# Patient Record
Sex: Male | Born: 1975 | Race: Black or African American | Hispanic: No | Marital: Single | Smoking: Never smoker
Health system: Southern US, Community
[De-identification: ages and names within clinical notes are randomized; demographics above are authoritative.]

## PROBLEM LIST (undated history)

## (undated) DIAGNOSIS — I480 Paroxysmal atrial fibrillation: Secondary | ICD-10-CM

## (undated) DIAGNOSIS — I499 Cardiac arrhythmia, unspecified: Secondary | ICD-10-CM

## (undated) DIAGNOSIS — I10 Essential (primary) hypertension: Secondary | ICD-10-CM

## (undated) DIAGNOSIS — M1990 Unspecified osteoarthritis, unspecified site: Secondary | ICD-10-CM

## (undated) DIAGNOSIS — F840 Autistic disorder: Secondary | ICD-10-CM

## (undated) DIAGNOSIS — E669 Obesity, unspecified: Secondary | ICD-10-CM

## (undated) DIAGNOSIS — E785 Hyperlipidemia, unspecified: Secondary | ICD-10-CM

## (undated) DIAGNOSIS — R569 Unspecified convulsions: Secondary | ICD-10-CM

## (undated) HISTORY — DX: Obesity, unspecified: E66.9

## (undated) HISTORY — DX: Unspecified osteoarthritis, unspecified site: M19.90

## (undated) HISTORY — DX: Cardiac arrhythmia, unspecified: I49.9

## (undated) HISTORY — PX: FOOT SURGERY: SHX648

## (undated) HISTORY — DX: Essential (primary) hypertension: I10

## (undated) HISTORY — DX: Paroxysmal atrial fibrillation: I48.0

## (undated) HISTORY — DX: Hyperlipidemia, unspecified: E78.5

## (undated) HISTORY — PX: TONSILLECTOMY AND ADENOIDECTOMY: SUR1326

---

## 2016-02-10 DIAGNOSIS — F5101 Primary insomnia: Secondary | ICD-10-CM | POA: Insufficient documentation

## 2016-02-10 DIAGNOSIS — G894 Chronic pain syndrome: Secondary | ICD-10-CM

## 2016-02-10 HISTORY — DX: Primary insomnia: F51.01

## 2016-02-10 HISTORY — DX: Chronic pain syndrome: G89.4

## 2016-03-27 ENCOUNTER — Inpatient Hospital Stay (HOSPITAL_COMMUNITY)
Admission: AD | Admit: 2016-03-27 | Discharge: 2016-04-01 | DRG: 101 | Disposition: A | Discharge status: Home / Self Care | Payer: Medicare Other | Source: Other Acute Inpatient Hospital | Attending: Internal Medicine | Admitting: Internal Medicine

## 2016-03-27 ENCOUNTER — Encounter (HOSPITAL_COMMUNITY): Payer: Self-pay | Admitting: Internal Medicine

## 2016-03-27 DIAGNOSIS — S01512A Laceration without foreign body of oral cavity, initial encounter: Secondary | ICD-10-CM | POA: Diagnosis present

## 2016-03-27 DIAGNOSIS — M6282 Rhabdomyolysis: Secondary | ICD-10-CM | POA: Diagnosis present

## 2016-03-27 DIAGNOSIS — I480 Paroxysmal atrial fibrillation: Secondary | ICD-10-CM | POA: Diagnosis present

## 2016-03-27 DIAGNOSIS — Z885 Allergy status to narcotic agent status: Secondary | ICD-10-CM

## 2016-03-27 DIAGNOSIS — R569 Unspecified convulsions: Secondary | ICD-10-CM

## 2016-03-27 DIAGNOSIS — E785 Hyperlipidemia, unspecified: Secondary | ICD-10-CM | POA: Diagnosis present

## 2016-03-27 DIAGNOSIS — I493 Ventricular premature depolarization: Secondary | ICD-10-CM | POA: Diagnosis present

## 2016-03-27 DIAGNOSIS — Z8249 Family history of ischemic heart disease and other diseases of the circulatory system: Secondary | ICD-10-CM

## 2016-03-27 DIAGNOSIS — F840 Autistic disorder: Secondary | ICD-10-CM | POA: Diagnosis present

## 2016-03-27 DIAGNOSIS — E870 Hyperosmolality and hypernatremia: Secondary | ICD-10-CM | POA: Diagnosis present

## 2016-03-27 DIAGNOSIS — I10 Essential (primary) hypertension: Secondary | ICD-10-CM | POA: Diagnosis present

## 2016-03-27 DIAGNOSIS — G4700 Insomnia, unspecified: Secondary | ICD-10-CM | POA: Diagnosis present

## 2016-03-27 DIAGNOSIS — Z880 Allergy status to penicillin: Secondary | ICD-10-CM

## 2016-03-27 DIAGNOSIS — R509 Fever, unspecified: Secondary | ICD-10-CM

## 2016-03-27 DIAGNOSIS — G40409 Other generalized epilepsy and epileptic syndromes, not intractable, without status epilepticus: Principal | ICD-10-CM | POA: Diagnosis present

## 2016-03-27 DIAGNOSIS — N179 Acute kidney failure, unspecified: Secondary | ICD-10-CM | POA: Diagnosis present

## 2016-03-27 HISTORY — DX: Unspecified convulsions: R56.9

## 2016-03-27 HISTORY — DX: Essential (primary) hypertension: I10

## 2016-03-27 HISTORY — DX: Autistic disorder: F84.0

## 2016-03-27 HISTORY — DX: Paroxysmal atrial fibrillation: I48.0

## 2016-03-27 LAB — CBC
HEMATOCRIT: 40.9 % (ref 39.0–52.0)
Hemoglobin: 13.3 g/dL (ref 13.0–17.0)
MCH: 23.3 pg — ABNORMAL LOW (ref 26.0–34.0)
MCHC: 32.5 g/dL (ref 30.0–36.0)
MCV: 71.8 fL — AB (ref 78.0–100.0)
Platelets: 198 10*3/uL (ref 150–400)
RBC: 5.7 MIL/uL (ref 4.22–5.81)
RDW: 17.7 % — ABNORMAL HIGH (ref 11.5–15.5)
WBC: 13.1 10*3/uL — AB (ref 4.0–10.5)

## 2016-03-27 LAB — MRSA PCR SCREENING: MRSA BY PCR: NEGATIVE

## 2016-03-27 LAB — CREATININE, SERUM: Creatinine, Ser: 1.36 mg/dL — ABNORMAL HIGH (ref 0.61–1.24)

## 2016-03-27 LAB — CK: CK TOTAL: 848 U/L — AB (ref 49–397)

## 2016-03-27 MED ORDER — DOCUSATE SODIUM 100 MG PO CAPS
100.0000 mg | ORAL_CAPSULE | Freq: Two times a day (BID) | ORAL | Status: DC
  Administered 2016-03-28: 100 mg via ORAL
  Filled 2016-03-27 (×4): qty 1

## 2016-03-27 MED ORDER — FLECAINIDE ACETATE 50 MG PO TABS
50.0000 mg | ORAL_TABLET | Freq: Two times a day (BID) | ORAL | Status: DC
Start: 2016-03-27 — End: 2016-04-01
  Administered 2016-03-28 – 2016-04-01 (×9): 50 mg via ORAL
  Filled 2016-03-27 (×10): qty 1

## 2016-03-27 MED ORDER — SODIUM CHLORIDE 0.45 % IV SOLN
INTRAVENOUS | Status: DC
  Administered 2016-03-27: 21:00:00 via INTRAVENOUS

## 2016-03-27 MED ORDER — SODIUM CHLORIDE 0.9 % IV SOLN
500.0000 mg | Freq: Two times a day (BID) | INTRAVENOUS | Status: DC
  Administered 2016-03-27 – 2016-03-29 (×5): 500 mg via INTRAVENOUS
  Filled 2016-03-27 (×9): qty 5

## 2016-03-27 MED ORDER — HYDRALAZINE HCL 20 MG/ML IJ SOLN: 10.0000 mg | INTRAMUSCULAR | Status: DC | PRN

## 2016-03-27 MED ORDER — ASPIRIN 325 MG PO TABS
325.0000 mg | ORAL_TABLET | Freq: Every day | ORAL | Status: DC
  Administered 2016-03-28: 325 mg via ORAL
  Filled 2016-03-27: qty 1

## 2016-03-27 MED ORDER — SODIUM CHLORIDE 0.9% FLUSH
3.0000 mL | Freq: Two times a day (BID) | INTRAVENOUS | Status: DC
  Administered 2016-03-27 – 2016-04-01 (×5): 3 mL via INTRAVENOUS

## 2016-03-27 MED ORDER — METOPROLOL TARTRATE 25 MG PO TABS
25.0000 mg | ORAL_TABLET | Freq: Two times a day (BID) | ORAL | Status: DC
  Administered 2016-03-28 – 2016-03-29 (×2): 25 mg via ORAL
  Filled 2016-03-27 (×3): qty 1

## 2016-03-27 MED ORDER — TRAZODONE HCL 100 MG PO TABS: 100.0000 mg | ORAL_TABLET | Freq: Every evening | ORAL | Status: DC | PRN

## 2016-03-27 MED ORDER — ACETAMINOPHEN 325 MG PO TABS
650.0000 mg | ORAL_TABLET | Freq: Four times a day (QID) | ORAL | Status: DC | PRN
  Administered 2016-03-29 (×2): 650 mg via ORAL
  Filled 2016-03-27 (×2): qty 2

## 2016-03-27 MED ORDER — ACETAMINOPHEN 650 MG RE SUPP
650.0000 mg | Freq: Four times a day (QID) | RECTAL | Status: DC | PRN
Start: 2016-03-27 — End: 2016-04-01

## 2016-03-27 MED ORDER — MONTELUKAST SODIUM 10 MG PO TABS
10.0000 mg | ORAL_TABLET | Freq: Every day | ORAL | Status: DC
  Administered 2016-03-28 – 2016-03-31 (×4): 10 mg via ORAL
  Filled 2016-03-27 (×5): qty 1

## 2016-03-27 MED ORDER — PRAVASTATIN SODIUM 20 MG PO TABS: 20.0000 mg | ORAL_TABLET | Freq: Every day | ORAL | Status: DC

## 2016-03-27 MED ORDER — ENOXAPARIN SODIUM 40 MG/0.4ML ~~LOC~~ SOLN
40.0000 mg | SUBCUTANEOUS | Status: DC
  Administered 2016-03-27 – 2016-03-31 (×5): 40 mg via SUBCUTANEOUS
  Filled 2016-03-27 (×5): qty 0.4

## 2016-03-27 MED ORDER — AMLODIPINE BESYLATE 5 MG PO TABS
5.0000 mg | ORAL_TABLET | Freq: Every day | ORAL | Status: DC
  Administered 2016-03-28 – 2016-04-01 (×5): 5 mg via ORAL
  Filled 2016-03-27 (×5): qty 1

## 2016-03-27 MED ORDER — LORAZEPAM 2 MG/ML IJ SOLN
4.0000 mg | INTRAMUSCULAR | Status: DC
  Administered 2016-03-27 – 2016-03-28 (×3): 4 mg via INTRAVENOUS
  Filled 2016-03-27 (×3): qty 2

## 2016-03-27 NOTE — H&P (Addendum)
[facility] History and Physical  Adrian Carter FXJ:[id number] DOB: 1976/08/05 DOA: 03/27/2016  Referring physician:  PCP: No primary care provider on file.   Chief Complaint: I had a seizure at home today  HPI: Adrian Carter is a 40 y.o. male with past medical history significant for seizure disorder, hypertension, hyperlipidemia, autism initially presented to [facility] because patient reportedly had a seizure at home. EMS called and was post-ictal on their arrival with blood in oral cavity. Transported to [place] where patient had 2 more episodes of witnessed grand mal seizures in 2 minutes. He was given 4 mg of Ativan, 1000 units of fosphenytoin. Patient unfortunately cannot recall the events that transpired, he denied any headache, does report some pain in his mouth after he bit his tongue. Patient is unsure if he had any urinary or stool incontinence. He denied any recent fever, chills or sweating. Has not had any recent head trauma. Patient said that he's been compliant with his antiepileptics. Patient unfortunately cannot recall the name of the medication. Also follows with a neurologist but he cannot recall the name. Reviewed on medication list looks like he takes lorazepam 1 mg by mouth twice a day.  After patient was given the Ativan and fosphenytoin at the neighboring facility arrangements were made to transfer him over to [facility] for further neurologic evaluation. Patient had a CBC that showed a normal white blood cell count of 9, hemoglobin was 14.9 hematocrit was 49 and his platelet count was 243,000. Metabolic panel shows a serum sodium of 152, potassium of 3.9, chloride 109, bicarbonate of 9, BUN of 10 creatinine of 1.2 and glucose of 148. AST was 30, ALT was 28. His urine toxicology was negative. So showed a specific gravity 1.015, 2+ protein, few bacteria, negative for nitrites or leukocyte esterase. He underwent a CT of his head which showed sinus inflammation but no  acute intercranial abnormality. Currently patient thinks that he is at [facility]. Oriented only to self. Again report some mild pain in his tongue and also reports some leg weakness and tenderness in his quadriceps.   Review of Systems:  Constitutional: No weight loss, night sweats, Fevers, chills, fatigue.  HEENT: His post witnessed seizure in the ER, denied headache, Difficulty swallowing despite his painful swollen tongue,Tooth/dental problems,Sore throat, Cardio-vascular:No chest pain, Orthopnea, PND, swelling in lower extremities, anasarca, dizziness, palpitations  GI: No heartburn, indigestion, abdominal pain, nausea, vomiting, diarrhea, change in bowel habits, loss of appetite  Resp: No shortness of breath with exertion or at rest. No excess mucus, mild cough productive of some yellowish sputum, No coughing up of blood.No change in color of mucus.No wheezing.No chest wall deformity  Skin: no rash or lesions.  GU: no dysuria, change in color of urine, no urgency or frequency. No flank pain.  Musculoskeletal:  Mild tenderness in his quadriceps. No decreased range of motion. No back pain.  Psych: No change in mood or affect. No depression or anxiety. No memory loss.  Neuro: No change in sensation, unilateral strength, or cognitive abilities. Seizure as above in history of present illness  All other systems were reviewed and are negative.  No past medical history on file. No past surgical history on file. Social History:  has no tobacco, alcohol, and drug history on file.  Allergies not on file  No family history on file.   Prior to Admission medications   Not on File   Physical Exam: Filed Vitals:   03/27/16 1838  BP: 144/97  Pulse: 77  Temp: 99.1 F (37.3 C)  TempSrc: Oral  Resp: 27  Height: 6\' 1"  (1.854 m)  Weight: 93.7 kg (206 lb 9.1 oz)  SpO2: 100%    Wt Readings from Last 3 Encounters:  03/27/16 93.7 kg (206 lb 9.1 oz)    General:  Appears calm and  comfortable Eyes:  PERRL, EOMI, normal lids, iris ENT:  grossly normal hearing, lips & tongue Neck:  no LAD, masses or thyromegaly Cardiovascular:  RRR, no m/r/g. No LE edema.  Respiratory:  CTA bilaterally, no w/r/r. Normal respiratory effort. Abdomen:  soft, ntnd Skin:  no rash or induration seen on limited exam Musculoskeletal:  grossly normal tone BUE/BLE Psychiatric:  grossly normal mood and affect, speech fluent and appropriate Neurologic:  CN 2-12 grossly intact, moves all extremities in coordinated fashion.          Labs on Admission:  Basic Metabolic Panel: No results for input(s): NA, K, CL, CO2, GLUCOSE, BUN, CREATININE, CALCIUM, MG, PHOS in the last 168 hours. Liver Function Tests: No results for input(s): AST, ALT, ALKPHOS, BILITOT, PROT, ALBUMIN in the last 168 hours. No results for input(s): LIPASE, AMYLASE in the last 168 hours. No results for input(s): AMMONIA in the last 168 hours. CBC: No results for input(s): WBC, NEUTROABS, HGB, HCT, MCV, PLT in the last 168 hours. Cardiac Enzymes: No results for input(s): CKTOTAL, CKMB, CKMBINDEX, TROPONINI in the last 168 hours.  BNP (last 3 results) No results for input(s): BNP in the last 8760 hours.  ProBNP (last 3 results) No results for input(s): PROBNP in the last 8760 hours.   Creatinine clearance cannot be calculated (No order found.)  CBG: No results for input(s): GLUCAP in the last 168 hours.  Radiological Exams on Admission: No results found.  EKG: Independently reviewed.   Assessment/Plan Active Problems:   Seizure (HCC)   1. Grand mal seizure. Witnessed at the neighboring facility, he was given 4 mg of Ativan as well as 1000 units of fosphenytoin. Dr. 03/29/16 of general neurology was consult to see and evaluate the patient. Patient reports that he been compliant with his antiepileptic medication. Looks like he normally only takes Ativan 1 mg PO BID. Lives with his mother who normally administers all  of his medicine. Will provide the patient with 4 mg of Ativan when necessary recurrent seizure, if happens again I will also give him 500 mg IV Keppra twice a day. Ordered an EEG for the morning. Consult general neurology as above. CT head showed no acute intracranial abnormality.  2. Hypernatremia. Will provide patient with IV hydration, repeat metabolic panel the morning.  3. Insomnia. Continue trazodone 100 mg daily at bedtime  4. Hypertension. Continue amlodipine 5 mg daily, metoprolol tartrate 25 mg twice a day, for tonight 50 mg twice a day and when necessary hydralazine 10 mg IV daily if systolic over 160, diastolic over 110. Heart healthy diet  5. Hyperlipidemia. Continue pravastatin 20 mg daily at bedtime  6. Seasonal allergies. Continue monthly Castello grams daily at bedtime    Code Status: Full code  DVT Prophylaxis: Lovenox 40 mg daily Family Communication: Absent for the bedside. Disposition Plan: Pending Improvement    Gasper Lloyd, MD Family Medicine [facility] [email or web address] Password [email or web address]

## 2016-03-28 ENCOUNTER — Observation Stay (HOSPITAL_COMMUNITY): Payer: Medicare Other

## 2016-03-28 ENCOUNTER — Encounter (HOSPITAL_COMMUNITY): Payer: Self-pay | Admitting: Neurology

## 2016-03-28 DIAGNOSIS — I10 Essential (primary) hypertension: Secondary | ICD-10-CM | POA: Diagnosis present

## 2016-03-28 DIAGNOSIS — N179 Acute kidney failure, unspecified: Secondary | ICD-10-CM | POA: Diagnosis present

## 2016-03-28 DIAGNOSIS — G4700 Insomnia, unspecified: Secondary | ICD-10-CM | POA: Diagnosis present

## 2016-03-28 DIAGNOSIS — G40909 Epilepsy, unspecified, not intractable, without status epilepticus: Secondary | ICD-10-CM | POA: Diagnosis present

## 2016-03-28 DIAGNOSIS — Z885 Allergy status to narcotic agent status: Secondary | ICD-10-CM | POA: Diagnosis not present

## 2016-03-28 DIAGNOSIS — G40409 Other generalized epilepsy and epileptic syndromes, not intractable, without status epilepticus: Secondary | ICD-10-CM | POA: Diagnosis present

## 2016-03-28 DIAGNOSIS — S01512A Laceration without foreign body of oral cavity, initial encounter: Secondary | ICD-10-CM | POA: Diagnosis present

## 2016-03-28 DIAGNOSIS — M6282 Rhabdomyolysis: Secondary | ICD-10-CM

## 2016-03-28 DIAGNOSIS — G40319 Generalized idiopathic epilepsy and epileptic syndromes, intractable, without status epilepticus: Secondary | ICD-10-CM

## 2016-03-28 DIAGNOSIS — R509 Fever, unspecified: Secondary | ICD-10-CM | POA: Diagnosis not present

## 2016-03-28 DIAGNOSIS — I493 Ventricular premature depolarization: Secondary | ICD-10-CM | POA: Diagnosis present

## 2016-03-28 DIAGNOSIS — R748 Abnormal levels of other serum enzymes: Secondary | ICD-10-CM | POA: Diagnosis not present

## 2016-03-28 DIAGNOSIS — F840 Autistic disorder: Secondary | ICD-10-CM | POA: Diagnosis present

## 2016-03-28 DIAGNOSIS — E785 Hyperlipidemia, unspecified: Secondary | ICD-10-CM | POA: Diagnosis present

## 2016-03-28 DIAGNOSIS — R4182 Altered mental status, unspecified: Secondary | ICD-10-CM | POA: Diagnosis not present

## 2016-03-28 DIAGNOSIS — Z880 Allergy status to penicillin: Secondary | ICD-10-CM | POA: Diagnosis not present

## 2016-03-28 DIAGNOSIS — R569 Unspecified convulsions: Secondary | ICD-10-CM

## 2016-03-28 DIAGNOSIS — I480 Paroxysmal atrial fibrillation: Secondary | ICD-10-CM | POA: Diagnosis present

## 2016-03-28 DIAGNOSIS — E870 Hyperosmolality and hypernatremia: Secondary | ICD-10-CM | POA: Diagnosis present

## 2016-03-28 DIAGNOSIS — Z8249 Family history of ischemic heart disease and other diseases of the circulatory system: Secondary | ICD-10-CM | POA: Diagnosis not present

## 2016-03-28 LAB — MAGNESIUM: Magnesium: 2.5 mg/dL — ABNORMAL HIGH (ref 1.7–2.4)

## 2016-03-28 LAB — COMPREHENSIVE METABOLIC PANEL
ALK PHOS: 94 U/L (ref 38–126)
ALT: 23 U/L (ref 17–63)
AST: 34 U/L (ref 15–41)
Albumin: 3.8 g/dL (ref 3.5–5.0)
Anion gap: 13 (ref 5–15)
BILIRUBIN TOTAL: 0.8 mg/dL (ref 0.3–1.2)
BUN: 10 mg/dL (ref 6–20)
CALCIUM: 8.8 mg/dL — AB (ref 8.9–10.3)
CO2: 21 mmol/L — ABNORMAL LOW (ref 22–32)
CREATININE: 1.42 mg/dL — AB (ref 0.61–1.24)
Chloride: 105 mmol/L (ref 101–111)
Glucose, Bld: 89 mg/dL (ref 65–99)
Potassium: 3.9 mmol/L (ref 3.5–5.1)
Sodium: 139 mmol/L (ref 135–145)
TOTAL PROTEIN: 7 g/dL (ref 6.5–8.1)

## 2016-03-28 LAB — CBC
HCT: 41 % (ref 39.0–52.0)
Hemoglobin: 13.1 g/dL (ref 13.0–17.0)
MCH: 22.9 pg — ABNORMAL LOW (ref 26.0–34.0)
MCHC: 32 g/dL (ref 30.0–36.0)
MCV: 71.7 fL — AB (ref 78.0–100.0)
PLATELETS: 181 10*3/uL (ref 150–400)
RBC: 5.72 MIL/uL (ref 4.22–5.81)
RDW: 17.4 % — AB (ref 11.5–15.5)
WBC: 9.5 10*3/uL (ref 4.0–10.5)

## 2016-03-28 LAB — PHOSPHORUS: PHOSPHORUS: 3.6 mg/dL (ref 2.5–4.6)

## 2016-03-28 LAB — CK: Total CK: 2331 U/L — ABNORMAL HIGH (ref 49–397)

## 2016-03-28 MED ORDER — CETYLPYRIDINIUM CHLORIDE 0.05 % MT LIQD
7.0000 mL | Freq: Two times a day (BID) | OROMUCOSAL | Status: DC
Start: 2016-03-28 — End: 2016-04-01
  Administered 2016-03-28 – 2016-03-31 (×7): 7 mL via OROMUCOSAL

## 2016-03-28 MED ORDER — OXYCODONE HCL 5 MG PO TABS
15.0000 mg | ORAL_TABLET | Freq: Three times a day (TID) | ORAL | Status: DC | PRN
Start: 2016-03-28 — End: 2016-04-01
  Administered 2016-03-31 – 2016-04-01 (×2): 15 mg via ORAL
  Filled 2016-03-28 (×2): qty 3

## 2016-03-28 MED ORDER — LORAZEPAM 1 MG PO TABS
1.0000 mg | ORAL_TABLET | Freq: Three times a day (TID) | ORAL | Status: DC | PRN
  Administered 2016-03-30: 1 mg via ORAL
  Filled 2016-03-28: qty 1

## 2016-03-28 MED ORDER — SODIUM CHLORIDE 0.9 % IV SOLN
INTRAVENOUS | Status: AC
  Administered 2016-03-28 – 2016-03-29 (×2): via INTRAVENOUS

## 2016-03-28 MED ORDER — ACETAMINOPHEN 160 MG/5ML PO SOLN
650.0000 mg | Freq: Four times a day (QID) | ORAL | Status: DC | PRN
  Administered 2016-03-28: 650 mg via ORAL
  Filled 2016-03-28: qty 20.3

## 2016-03-28 MED ORDER — LORAZEPAM 2 MG/ML IJ SOLN
1.0000 mg | INTRAMUSCULAR | Status: DC | PRN
  Administered 2016-03-28: 1 mg via INTRAVENOUS
  Filled 2016-03-28: qty 1

## 2016-03-28 MED ORDER — CHLORHEXIDINE GLUCONATE 0.12 % MT SOLN
15.0000 mL | Freq: Two times a day (BID) | OROMUCOSAL | Status: DC
  Administered 2016-03-28 – 2016-04-01 (×9): 15 mL via OROMUCOSAL
  Filled 2016-03-28 (×8): qty 15

## 2016-03-28 NOTE — Progress Notes (Signed)
EEG completed; results pending.    

## 2016-03-28 NOTE — Consult Note (Signed)
NEURO HOSPITALIST CONSULT NOTE   Requestig physician: Dr. Roanna Epley   Reason for Consult: seizure   History obtained from:  Patient and Chart     HPI:                                                                                                                                          Adrian Carter is an 40 y.o. male with seizure. Much of history obtained from chart as he cannot recall events. Per chart he has past medical history "significant for seizure disorder, hypertension, hyperlipidemia, autism initially presented to [facility] because patient reportedly had a seizure at home. EMS called and was post-ictal on their arrival with blood in oral cavity. Transported to [place] where patient had 2 more episodes of witnessed grand mal seizures in 2 minutes. He was given 4 mg of Ativan, 1000 units of fosphenytoin. Patient unfortunately cannot recall the events that transpired, he denied any headache, does report some pain in his mouth after he bit his tongue. Patient is unsure if he had any urinary or stool incontinence. He denied any recent fever, chills or sweating. Has not had any recent head trauma. Patient said that he's been compliant with his antiepileptics. Patient unfortunately cannot recall the name of the medication. Also follows with a neurologist but he cannot recall the name. Reviewed on medication list looks like he takes lorazepam 1 mg by mouth twice a day. While at [facility] he had a  CBC that showed a normal white blood cell count of 9, hemoglobin was 14.9 hematocrit was 49 and his platelet count was 243,000. Metabolic panel shows a serum sodium of 152, potassium of 3.9, chloride 109, bicarbonate of 9, BUN of 10 creatinine of 1.2 and glucose of 148. AST was 30, ALT was 28. His urine toxicology was negative. So showed a specific gravity 1.015, 2+ protein, few bacteria, negative for nitrites or leukocyte esterase. He underwent a CT of his head which  showed sinus inflammation but no acute intercranial abnormality.   November started having seizures with no known cause. Unsure how many seizures as he was found in the bathroom with new afib  And was in the hospital  For 8 days.  Then again in [place]ce[place] had further seizures and underwent more of the same tests--per mother.  He was never placed on medication. 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 No febrile seizures noted. Mothers sibling had epilepsy!   Past Medical History  Diagnosis Date  . Seizure (HCC)   . Autism     Past Surgical History  Procedure Laterality Date  . Tonsillectomy and adenoidectomy    . Foot surgery  Family History  Problem Relation Age of Onset  . Hypertension Mother      Social History:  reports that he has never smoked. He has never used smokeless tobacco. He reports that he does not drink alcohol or use illicit drugs.  Allergies  Allergen Reactions  . Morphine And Related Anaphylaxis  . Penicillins Anaphylaxis    MEDICATIONS:                                                                                                                     Prior to Admission:  No prescriptions prior to admission   Scheduled: . amLODipine  5 mg Oral Daily  . aspirin  325 mg Oral Daily  . docusate sodium  100 mg Oral BID  . enoxaparin (LOVENOX) injection  40 mg Subcutaneous Q24H  . flecainide  50 mg Oral Q12H  . levETIRAcetam  500 mg Intravenous Q12H  . metoprolol tartrate  25 mg Oral BID  . montelukast  10 mg Oral QHS  . pravastatin  20 mg Oral q1800  . sodium chloride flush  3 mL Intravenous Q12H     ROS:                                                                                                                                       History obtained from the patient  General ROS: negative for - chills, fatigue, fever, night sweats, weight gain or weight  loss Psychological ROS: negative for - behavioral disorder, hallucinations, memory difficulties, mood swings or suicidal ideation Ophthalmic ROS: negative for - blurry vision, double vision, eye pain or loss of vision ENT ROS: negative for - epistaxis, nasal discharge, oral lesions, sore throat, tinnitus or vertigo Allergy and Immunology ROS: negative for - hives or itchy/watery eyes Hematological and Lymphatic ROS: negative for - bleeding problems, bruising or swollen lymph nodes Endocrine ROS: negative for - galactorrhea, hair pattern changes, polydipsia/polyuria or temperature intolerance Respiratory ROS: negative for - cough, hemoptysis, shortness of breath or wheezing Cardiovascular ROS: negative for - chest pain, dyspnea on exertion, edema or irregular heartbeat Gastrointestinal ROS: negative for - abdominal pain, diarrhea, hematemesis, nausea/vomiting or stool incontinence Genito-Urinary ROS: negative for - dysuria, hematuria, incontinence or urinary frequency/urgency Musculoskeletal ROS: negative for - joint swelling or muscular weakness Neurological ROS: as noted in HPI Dermatological ROS: negative for rash and skin lesion changes   Blood pressure 125/80,  pulse 81, temperature 97.9 F (36.6 C), temperature source Oral, resp. rate 20, height 6\' 1"  (1.854 m), weight 93.7 kg (206 lb 9.1 oz), SpO2 96 %.   Neurologic Examination:                                                                                                      HEENT-  Normocephalic, no lesions, without obvious abnormality.  Normal external eye and conjunctiva.  Normal TM's bilaterally.  Normal auditory canals and external ears. Normal external nose, mucus membranes and septum.  Normal pharynx. Cardiovascular- S1, S2 normal, pulses palpable throughout   Lungs- chest clear, no wheezing, rales, normal symmetric air entry Abdomen- normal findings: bowel sounds normal Extremities- no edema Lymph-no adenopathy  palpable Musculoskeletal-no joint tenderness, deformity or swelling Skin-dry  Neurological Examination Mental Status: Alert, oriented to hospital  Speech dysarthric secondary to bitten tougue without evidence of aphasia.  Able to follow simple commands without difficulty. Cranial Nerves: II:  Visual fields grossly normal, pupils equal, round, reactive to light and accommodation III,IV, VI: ptosis not present, extra-ocular motions intact bilaterally V,VII: smile asymmetric with decreased movement on the left mouth, facial light touch sensation normal bilaterally VIII: hearing normal bilaterally IX,X: uvula rises symmetrically XI: bilateral shoulder shrug XII: midline tongue extension--significantly swollen do to trauma Motor: 4/5 throughout with decreased strength on the right UE but he is very drowsy Sensory: Pinprick and light touch intact throughout, bilaterally Deep Tendon Reflexes: 2+ and symmetric throughout--with cross adductors Plantars: Right: downgoing   Left: downgoing Cerebellar: normal finger-to-nose Gait: notested      Lab Results: Basic Metabolic Panel:  Recent Labs Lab 03/27/16 2004 03/28/16 0554  NA  --  139  K  --  3.9  CL  --  105  CO2  --  21*  GLUCOSE  --  89  BUN  --  10  CREATININE 1.36* 1.42*  CALCIUM  --  8.8*    Liver Function Tests:  Recent Labs Lab 03/28/16 0554  AST 34  ALT 23  ALKPHOS 94  BILITOT 0.8  PROT 7.0  ALBUMIN 3.8   No results for input(s): LIPASE, AMYLASE in the last 168 hours. No results for input(s): AMMONIA in the last 168 hours.  CBC:  Recent Labs Lab 03/27/16 2004  WBC 13.1*  HGB 13.3  HCT 40.9  MCV 71.8*  PLT 198    Cardiac Enzymes:  Recent Labs Lab 03/27/16 2136 03/28/16 0554  CKTOTAL 848* 2331*    Lipid Panel: No results for input(s): CHOL, TRIG, HDL, CHOLHDL, VLDL, LDLCALC in the last 168 hours.  CBG: No results for input(s): GLUCAP in the last 168 hours.  Microbiology: Results for  orders placed or performed during the hospital encounter of 03/27/16  MRSA PCR Screening     Status: None   Collection Time: 03/27/16  6:40 PM  Result Value Ref Range Status   MRSA by PCR NEGATIVE NEGATIVE Final    Comment:        The GeneXpert MRSA Assay (FDA approved for NASAL specimens only), is one component  of a comprehensive MRSA colonization surveillance program. It is not intended to diagnose MRSA infection nor to guide or monitor treatment for MRSA infections.     Coagulation Studies: No results for input(s): LABPROT, INR in the last 72 hours.  Imaging: No results found.     Assessment and plan per attending neurologist  Felicie Morn PA-C [facility] [phone]  03/28/2016, 8:39 AM  On my exam, he awakens and interacts, but is lethargic. He follows commands and answers questions.   Assessment/Plan:  40 yo M with multiple episodes now of seizures. I think that he will need AED therapy from here on out. He had an MRI in December, but would repeat this now. He continues to be drowsy, likely medication effect.   1) Keppra 500mg  Q12H 2) MRI 3) EEG 4) will follow.   , MD [facility] [phone]  If 7pm- 7am, please page neurology on call as listed in AMION.

## 2016-03-28 NOTE — Procedures (Signed)
HPI:  40 y/o with multiple witnessed seizures.    TECHNICAL SUMMARY:  A multichannel referential and bipolar montage EEG using the standard international 10-20 system was performed on the patient described as confused and drowsy.  During the very brief portion of the tracing in which the patient is most awake, there is a 10-11 Hz rhythm that is reactive.  There is an excessive amount of fast (beta) activity distributed symmetrically.    ACTIVATION:  Stepwise photic stimulation at 4-20 flashes per second was performed and did not elicit any abnormal waveforms.  Hyperventilation was attempted, but the patient could not cooperate.  EPILEPTIFORM ACTIVITY:  There were no spikes, sharp waves or paroxysmal activity.  SLEEP:  Much of this recording is spent in stage II sleep architecture.  CARDIAC:  The EKG lead revealed a regular sinus rhythm.  IMPRESSION:  This is a normal drowsy and asleep EEG for the patients stated age.  During the very brief portion in which the patient was most awake, there was a normal waking rhythm.  There was an excessive amount of fast (beta) activity which is usually due to medication effect, such as benzodiazepines.  There were no focal, hemispheric or lateralizing features.  No epileptiform activity was recorded.  A normal EEG does not exclude the diagnosis of a seizure disorder and if seizure remains high on the list of differential diagnosis, an ambulatory EEG may be of value.  Clinical correlation is required.

## 2016-03-28 NOTE — Progress Notes (Addendum)
PROGRESS NOTE                                                                                                                                                                                                             Patient Demographics:    Adrian Carter, is a 40 y.o. male, DOB - [birth year], EML:[id number]  Admit date - 03/27/2016   Admitting Physician Jeralyn Bennett, MD  Outpatient Primary MD for the patient is No primary care provider on file.  LOS - 1  Outpatient Specialists:  Seizures  Brief Narrative 40 year old male with history of hypertension, hyperlipidemia, autism initially brought to [facility] ED by EMS after he had a generalized tonic-clonic seizures at home. This was witnessed by his mother. Patient doesn't recall the event and mother not sure if he had bowel urinary incontinence. No recent fevers, head trauma. Patient was brought to [facility] ED by EMS. At the [facility] ED his vitals were stable and normal CBC. Chemistry showed hypernatremia with sodium of 152, normal renal function, LFTs and negative urine tox. Head CT done was negative for acute intracranial abnormality. Patient had 2 more episodes of witnessed tonic-clonic seizures in the ED. He was given 4 mg of IV Ativan and 1 g of fosphenytoin. Neuro hospitalist was consulted by [facility] ED and recommended transfer to [facility] for further evaluation. On initial exam patient was disoriented to place. He complained of pain in his tongue from the tongue and some weakness in his legs.  Upon discussing with patient's mother on the phone this morning she reports that patient started having seizures since November 2016. He was hospitalized twice at [facility], in November and December 2016 when he had seizures. He was seen by his neurologist in [place] and recommended that the seizure did not have any clear cause. He also had an MRI of his brain  during his hospitalization at [facility] which was unremarkable. Patient was not started on any medications. He was recently diagnosed of A. fib and started on metoprolol. Patient admitted to stepdown unit.     Subjective:    Patient seen and examined. Denies any headache or weakness. Has difficulty talking due to his tongue swelling. Stable on telemetry.   Assessment  & Plan :   Principal problem Generalized tonic-clonic seizures Continue step down monitoring for now. EEG ordered. Seizure  precautions. When necessary IV Ativan. Started on Keppra 500 mg every 12 hours. Neurology consulted.  Acute rhabdomyolysis Secondary to seizure. Mild acute kidney injury. CPK elevated to 2300 this morning. Discontinue statin. Switch fluids to IV normal saline @150  mL per hour. LFTs normal.    Active Problems:    Hyperlipidemia  Hold statin due to rhabdomyolysis    Essential hypertension Stable. Continue metoprolol and amlodipine  A. fib Recently diagnosed as per mother. On metoprolol and flecainide  Hypernatremia Improved with fluids.       Code Status : Full code  Family Communication  : Spoke with mother on the phone  Disposition Plan  : , Transferred to telemetry later today if no further seizure activity  Barriers For Discharge :  Seizure workup and monitoring  Consults  :  Neurology  Procedures  : EEG  DVT Prophylaxis  :  Lovenox -   Lab Results  Component Value Date   PLT 181 03/28/2016    Antibiotics  :  None  Anti-infectives    None        Objective:   Filed Vitals:   03/27/16 2345 03/28/16 0415 03/28/16 0420 03/28/16 0700  BP: 118/57  125/80   Pulse: 66  81   Temp:  98.1 F (36.7 C)  97.9 F (36.6 C)  TempSrc:  Oral  Oral  Resp: 20  20   Height:      Weight:      SpO2: 96%  96%     Wt Readings from Last 3 Encounters:  03/27/16 93.7 kg (206 lb 9.1 oz)     Intake/Output Summary (Last 24 hours) at 03/28/16 0905 Last data filed at 03/28/16  0700  Gross per 24 hour  Intake   1055 ml  Output   1000 ml  Net     55 ml     Physical Exam  Gen: not in distress HEENT:, Swollen tongue, no bleeding, no pallor, moist mucosa, supple neck Chest: clear b/l, no added sounds CVS: N S1&S2, no murmurs, rubs or gallop GI: soft, NT, ND, BS+ Musculoskeletal: warm, no edema CNS: AAOX3, non focal    Data Review:    CBC  Recent Labs Lab 03/27/16 2004 03/28/16 0554  WBC 13.1* 9.5  HGB 13.3 13.1  HCT 40.9 41.0  PLT 198 181  MCV 71.8* 71.7*  MCH 23.3* 22.9*  MCHC 32.5 32.0  RDW 17.7* 17.4*    Chemistries   Recent Labs Lab 03/27/16 2004 03/28/16 0554  NA  --  139  K  --  3.9  CL  --  105  CO2  --  21*  GLUCOSE  --  89  BUN  --  10  CREATININE 1.36* 1.42*  CALCIUM  --  8.8*  MG  --  2.5*  AST  --  34  ALT  --  23  ALKPHOS  --  94  BILITOT  --  0.8   ------------------------------------------------------------------------------------------------------------------ No results for input(s): CHOL, HDL, LDLCALC, TRIG, CHOLHDL, LDLDIRECT in the last 72 hours.  No results found for: HGBA1C ------------------------------------------------------------------------------------------------------------------ No results for input(s): TSH, T4TOTAL, T3FREE, THYROIDAB in the last 72 hours.  Invalid input(s): FREET3 ------------------------------------------------------------------------------------------------------------------ No results for input(s): VITAMINB12, FOLATE, FERRITIN, TIBC, IRON, RETICCTPCT in the last 72 hours.  Coagulation profile No results for input(s): INR, PROTIME in the last 168 hours.  No results for input(s): DDIMER in the last 72 hours.  Cardiac Enzymes No results for input(s): CKMB, TROPONINI, MYOGLOBIN in the  last 168 hours.  Invalid input(s): CK ------------------------------------------------------------------------------------------------------------------ No results found for:  BNP  Inpatient Medications  Scheduled Meds: . amLODipine  5 mg Oral Daily  . aspirin  325 mg Oral Daily  . docusate sodium  100 mg Oral BID  . enoxaparin (LOVENOX) injection  40 mg Subcutaneous Q24H  . flecainide  50 mg Oral Q12H  . levETIRAcetam  500 mg Intravenous Q12H  . metoprolol tartrate  25 mg Oral BID  . montelukast  10 mg Oral QHS  . pravastatin  20 mg Oral q1800  . sodium chloride flush  3 mL Intravenous Q12H   Continuous Infusions: . sodium chloride 100 mL/hr at 03/28/16 0600   PRN Meds:.acetaminophen **OR** acetaminophen, hydrALAZINE, LORazepam, traZODone  Micro Results Recent Results (from the past 240 hour(s))  MRSA PCR Screening     Status: None   Collection Time: 03/27/16  6:40 PM  Result Value Ref Range Status   MRSA by PCR NEGATIVE NEGATIVE Final    Comment:        The GeneXpert MRSA Assay (FDA approved for NASAL specimens only), is one component of a comprehensive MRSA colonization surveillance program. It is not intended to diagnose MRSA infection nor to guide or monitor treatment for MRSA infections.     Radiology Reports No results found.  Time Spent in minutes  25   Eddie North M.D on 03/28/2016 at 9:05 AM  Between 7am to 7pm - Pager - [phone]  After 7pm go to [email or web address] - password [facility]  [facility] -  Office  [phone]

## 2016-03-29 ENCOUNTER — Encounter (HOSPITAL_COMMUNITY): Payer: Self-pay

## 2016-03-29 DIAGNOSIS — I480 Paroxysmal atrial fibrillation: Secondary | ICD-10-CM | POA: Insufficient documentation

## 2016-03-29 DIAGNOSIS — S01512A Laceration without foreign body of oral cavity, initial encounter: Secondary | ICD-10-CM

## 2016-03-29 LAB — URINE MICROSCOPIC-ADD ON

## 2016-03-29 LAB — BASIC METABOLIC PANEL
Anion gap: 8 (ref 5–15)
BUN: 5 mg/dL — ABNORMAL LOW (ref 6–20)
CALCIUM: 8.5 mg/dL — AB (ref 8.9–10.3)
CHLORIDE: 109 mmol/L (ref 101–111)
CO2: 22 mmol/L (ref 22–32)
CREATININE: 1.22 mg/dL (ref 0.61–1.24)
GFR calc Af Amer: >60 mL/min (ref >60)
GFR calc non Af Amer: >60 mL/min (ref >60)
GLUCOSE: 91 mg/dL (ref 65–99)
Potassium: 3.7 mmol/L (ref 3.5–5.1)
Sodium: 139 mmol/L (ref 135–145)

## 2016-03-29 LAB — URINALYSIS, ROUTINE W REFLEX MICROSCOPIC
GLUCOSE, UA: NEGATIVE mg/dL
Ketones, ur: 40 mg/dL — AB
Leukocytes, UA: NEGATIVE
NITRITE: NEGATIVE
PH: 6 (ref 5.0–8.0)
PROTEIN: NEGATIVE mg/dL
SPECIFIC GRAVITY, URINE: 1.019 (ref 1.005–1.030)

## 2016-03-29 LAB — CK: Total CK: 15885 U/L — ABNORMAL HIGH (ref 49–397)

## 2016-03-29 MED ORDER — METOPROLOL TARTRATE 50 MG PO TABS
50.0000 mg | ORAL_TABLET | Freq: Two times a day (BID) | ORAL | Status: DC
  Administered 2016-03-29 – 2016-04-01 (×7): 50 mg via ORAL
  Filled 2016-03-29 (×7): qty 1

## 2016-03-29 MED ORDER — SODIUM CHLORIDE 0.9 % IV SOLN
INTRAVENOUS | Status: DC
  Administered 2016-03-29 – 2016-03-30 (×3): via INTRAVENOUS

## 2016-03-29 MED ORDER — SENNA 8.6 MG PO TABS: 1.0000 | ORAL_TABLET | Freq: Every day | ORAL | Status: DC | PRN

## 2016-03-29 NOTE — Consult Note (Signed)
Reason for Consult: Tongue injury Referring Physician: Louellen Molder, MD  Adrian Carter is an 40 y.o. male.  HPI: Patient had a seizure on Sunday afternoon and bit his tongue. He has been in the hospital since. There has been intermittent slow oozing from the tongue. He has been able to drink and is on a clear diet. Workup for the seizure has been negative so far. He does have a history of autism and other health problems. He was on aspirin but no other anticoagulation.  Past Medical History  Diagnosis Date  . Seizure ([place])   . Autism   . PAF (paroxysmal atrial fibrillation) [facility])     Past Surgical History  Procedure Laterality Date  . Tonsillectomy and adenoidectomy    . Foot surgery      Family History  Problem Relation Age of Onset  . Hypertension Mother     Social History:  reports that he has never smoked. He has never used smokeless tobacco. He reports that he does not drink alcohol or use illicit drugs.  Allergies:  Allergies  Allergen Reactions  . Morphine And Related Anaphylaxis  . Penicillins Anaphylaxis    Has patient had a PCN reaction causing immediate rash, facial/tongue/throat swelling, SOB or lightheadedness with hypotension: No Has patient had a PCN reaction causing severe rash involving mucus membranes or skin necrosis: No Has patient had a PCN reaction that required hospitalization No Has patient had a PCN reaction occurring within the last 10 years: No If all of the above answers are "NO", then may proceed with Cephalosporin use.    Medications: Reviewed  Results for orders placed or performed during the hospital encounter of 03/27/16 (from the past 48 hour(s))  MRSA PCR Screening     Status: None   Collection Time: 03/27/16  6:40 PM  Result Value Ref Range   MRSA by PCR NEGATIVE NEGATIVE    Comment:        The GeneXpert MRSA Assay (FDA approved for NASAL specimens only), is one component of a comprehensive MRSA colonization surveillance  program. It is not intended to diagnose MRSA infection nor to guide or monitor treatment for MRSA infections.   CBC     Status: Abnormal   Collection Time: 03/27/16  8:04 PM  Result Value Ref Range   WBC 13.1 (H) 4.0 - 10.5 K/uL   RBC 5.70 4.22 - 5.81 MIL/uL   Hemoglobin 13.3 13.0 - 17.0 g/dL   HCT 40.9 39.0 - 52.0 %   MCV 71.8 (L) 78.0 - 100.0 fL   MCH 23.3 (L) 26.0 - 34.0 pg   MCHC 32.5 30.0 - 36.0 g/dL   RDW 17.7 (H) 11.5 - 15.5 %   Platelets 198 150 - 400 K/uL  Creatinine, serum     Status: Abnormal   Collection Time: 03/27/16  8:04 PM  Result Value Ref Range   Creatinine, Ser 1.36 (H) 0.61 - 1.24 mg/dL   GFR calc non Af Amer >60 >60 mL/min   GFR calc Af Amer >60 >60 mL/min    Comment: (NOTE) The eGFR has been calculated using the CKD EPI equation. This calculation has not been validated in all clinical situations. eGFR's persistently <60 mL/min signify possible Chronic Kidney Disease.   CK     Status: Abnormal   Collection Time: 03/27/16  9:36 PM  Result Value Ref Range   Total CK 848 (H) 49 - 397 U/L  Comprehensive metabolic panel     Status: Abnormal   Collection  Time: 03/28/16  5:54 AM  Result Value Ref Range   Sodium 139 135 - 145 mmol/L   Potassium 3.9 3.5 - 5.1 mmol/L   Chloride 105 101 - 111 mmol/L   CO2 21 (L) 22 - 32 mmol/L   Glucose, Bld 89 65 - 99 mg/dL   BUN 10 6 - 20 mg/dL   Creatinine, Ser 1.42 (H) 0.61 - 1.24 mg/dL   Calcium 8.8 (L) 8.9 - 10.3 mg/dL   Total Protein 7.0 6.5 - 8.1 g/dL   Albumin 3.8 3.5 - 5.0 g/dL   AST 34 15 - 41 U/L   ALT 23 17 - 63 U/L   Alkaline Phosphatase 94 38 - 126 U/L   Total Bilirubin 0.8 0.3 - 1.2 mg/dL   GFR calc non Af Amer >60 >60 mL/min   GFR calc Af Amer >60 >60 mL/min    Comment: (NOTE) The eGFR has been calculated using the CKD EPI equation. This calculation has not been validated in all clinical situations. eGFR's persistently <60 mL/min signify possible Chronic Kidney Disease.    Anion gap 13 5 - 15   CBC     Status: Abnormal   Collection Time: 03/28/16  5:54 AM  Result Value Ref Range   WBC 9.5 4.0 - 10.5 K/uL   RBC 5.72 4.22 - 5.81 MIL/uL   Hemoglobin 13.1 13.0 - 17.0 g/dL   HCT 41.0 39.0 - 52.0 %   MCV 71.7 (L) 78.0 - 100.0 fL   MCH 22.9 (L) 26.0 - 34.0 pg   MCHC 32.0 30.0 - 36.0 g/dL   RDW 17.4 (H) 11.5 - 15.5 %   Platelets 181 150 - 400 K/uL  CK     Status: Abnormal   Collection Time: 03/28/16  5:54 AM  Result Value Ref Range   Total CK 2331 (H) 49 - 397 U/L  Magnesium     Status: Abnormal   Collection Time: 03/28/16  5:54 AM  Result Value Ref Range   Magnesium 2.5 (H) 1.7 - 2.4 mg/dL  Phosphorus     Status: None   Collection Time: 03/28/16  5:54 AM  Result Value Ref Range   Phosphorus 3.6 2.5 - 4.6 mg/dL  CK     Status: Abnormal   Collection Time: 03/29/16  4:16 AM  Result Value Ref Range   Total CK 15885 (H) 49 - 397 U/L    Comment: RESULTS CONFIRMED BY MANUAL DILUTION  Basic metabolic panel     Status: Abnormal   Collection Time: 03/29/16  4:16 AM  Result Value Ref Range   Sodium 139 135 - 145 mmol/L   Potassium 3.7 3.5 - 5.1 mmol/L   Chloride 109 101 - 111 mmol/L   CO2 22 22 - 32 mmol/L   Glucose, Bld 91 65 - 99 mg/dL   BUN 5 (L) 6 - 20 mg/dL   Creatinine, Ser 1.22 0.61 - 1.24 mg/dL   Calcium 8.5 (L) 8.9 - 10.3 mg/dL   GFR calc non Af Amer >60 >60 mL/min   GFR calc Af Amer >60 >60 mL/min    Comment: (NOTE) The eGFR has been calculated using the CKD EPI equation. This calculation has not been validated in all clinical situations. eGFR's persistently <60 mL/min signify possible Chronic Kidney Disease.    Anion gap 8 5 - 15    Mr Brain Wo Contrast  03/28/2016  CLINICAL DATA:  Seizure EXAM: MRI HEAD WITHOUT CONTRAST TECHNIQUE: Multiplanar, multiecho pulse sequences of the brain and  surrounding structures were obtained without intravenous contrast. COMPARISON:  None. FINDINGS: Image quality degraded by significant motion. Ventricle size normal.  Cerebral  volume normal. Negative for acute or chronic infarction. Negative for intracranial hemorrhage.  No fluid collection. Negative for mass or edema.  No shift of the midline structures. Paranasal sinuses clear.  Normal orbit.  Pituitary not enlarged. IMPRESSION: Image quality degraded by significant motion. No significant abnormality detected on this motion degraded study. Electronically Signed   By: Franchot Gallo M.D.   On: 03/28/2016 14:20   Dg Chest Port 1 View  03/28/2016  CLINICAL DATA:  NEW ONSET FEVER EXAM: PORTABLE CHEST - 1 VIEW COMPARISON:  None available FINDINGS: Lungs are clear. Heart size and mediastinal contours are within normal limits. No effusion. Visualized skeletal structures are unremarkable. IMPRESSION: No acute cardiopulmonary disease. Electronically Signed   By: Lucrezia Europe M.D.   On: 03/28/2016 14:54    XIV:[id number] except as listed in admit H&P  Blood pressure 121/63, pulse 91, temperature 99.2 F (37.3 C), temperature source Oral, resp. rate 25, height '6\' 1"'  (1.854 m), weight 93.7 kg (206 lb 9.1 oz), SpO2 96 %.  PHYSICAL EXAM: Overall appearance:  Healthy appearing, in no distress. No trouble breathing or handling secretions. Head:  Normocephalic, atraumatic. Ears: External ears look normal. Nose: External nose is healthy in appearance. Internal nasal exam free of any lesions or obstruction. Oral Cavity/Pharynx:  There are no mucosal lesions or masses identified. Left side of the tongue is ecchymotic and swollen but mobile. There does appear to be a small laceration in the anterolateral undersurface. There is no devitalized tissue. There is no freely mobile flap. Larynx/Hypopharynx: Deferred Neuro:  Minimal evaluation secondary to autism. Neck: No palpable neck masses.  Studies Reviewed: none  Procedures: none   Assessment/Plan: Tongue laceration, without any devitalized tissue or ongoing bleeding. Hemoglobin has been stable. Continue on liquid diet until things are  healing better. No intervention needed currently. If bleeding becomes heavy, contact me immediately.  Adrian Carter 03/29/2016, 9:02 AM

## 2016-03-29 NOTE — Care Management Note (Signed)
Case Management Note  Patient Details  Name: Cainan Trull MRN: [id number] Date of Birth: 1976/01/25  Subjective/Objective:    Patient lives with mom at home, she takes him to his MD appts.  he has PCP and Medicare insurance, ENT following, for swollen tongue, Keppra will be new med for patient.  NCM will cont to follow for dc needs.                 Action/Plan: had a seizure on Sunday afternoon and bit his tongue, Continue on liquid diet until things are healing better. No intervention needed currently.   Expected Discharge Date:                  Expected Discharge Plan:  Home/Self Care  In-House Referral:     Discharge planning Services  CM Consult  Post Acute Care Choice:    Choice offered to:     DME Arranged:    DME Agency:     HH Arranged:    HH Agency:     Status of Service:  Completed, signed off  Medicare Important Message Given:    Date Medicare IM Given:    Medicare IM give by:    Date Additional Medicare IM Given:    Additional Medicare Important Message give by:     If discussed at Long Length of Stay Meetings, dates discussed:    Additional Comments:  Leone Haven, RN 03/29/2016, 2:59 PM

## 2016-03-29 NOTE — Progress Notes (Addendum)
PROGRESS NOTE                                                                                                                                                                                                             Patient Demographics:    Adrian Carter, is a 40 y.o. male, DOB - 08-Jun-1976, WNI:[id number]  Admit date - 03/27/2016   Admitting Physician Jeralyn Bennett, MD  Outpatient Primary MD for the patient is No primary care provider on file.  LOS - 2  Outpatient Specialists:  Seizures  Brief Narrative 40 year old male with history of hypertension, hyperlipidemia, autism initially brought to [facility] ED by EMS after he had a generalized tonic-clonic seizures at home. This was witnessed by his mother. Patient doesn't recall the event and mother not sure if he had bowel urinary incontinence. No recent fevers, head trauma. Patient had 2 prior episodes of seizures back in November and December 2016 when he was admitted to [facility]. He had MRI brain done at that time which was unremarkable. He was not started on antiepileptics but was following with a neurologist in [place].  Patient was brought to [facility] ED by EMS. At [facility] ED his vitals were stable and normal CBC. Chemistry showed hypernatremia with sodium of 152, normal renal function, LFTs and negative urine tox. Head CT done was negative for acute intracranial abnormality. Patient had 2 more episodes of witnessed tonic-clonic seizures in the ED. He was given 4 mg of IV Ativan and 1 g of fosphenytoin. Neuro- hospitalist was consulted by [facility] ED and recommended transfer to [facility] for further evaluation. No further seizure activity.  Patient monitoring stepdown unit.       Subjective:    Patient seen and examined. Has been losing small amount of blood from his abdominal laceration site. No further seizure activity. Denies any  symptoms.   Assessment  & Plan :   Principal problem Generalized tonic-clonic seizures Stable on stepdown with heart rate in low 100. Also Temperature spike of 101 F . MRI brain unremarkable. EEG done without epileptiform activity. Started on Keppra 500 mg twice daily and will be discharged on it. Neurology signed off. Continue when necessary Ativan for seizures. Transfer to telemetry. Patient should follow-up with his neurologist upon discharge. Patient does not drive any vehicle or operate any machinery. (He  does not work and lives with his mother at home)   Active Problems: Acute rhabdomyolysis Secondary to seizure. Mild acute kidney injury which resolved with hydration. CPK further elevated to >15,000. Statin discontinued. Continue IV normal saline at 150 mL per hour. LFTs normal. Monitor CPK and function daily.  Fever Suspect due to acute seizures. Chest x-ray unremarkable. Check UA and blood culture. Tylenol when necessary for supportive care.  Tongue laceration with bleeding, following seizures ENT consulted who evaluated the patient and recommended that he did not have any devitalized tissue or ongoing bleeding that required urgent intervention .since hemoglobin was stable, recommended to continue liquid diet until healing is improved..  Aspirin discontinued Consult ENT again if he has increased bleeding      Essential hypertension Stable. Continue amlodipine. Increase metoprolol dose as patient having frequent PVCs and heart rate elevated.  A. fib Recently diagnosed as per mother. CHADS2 vasc of 1 . On full dose aspirin which is scheduled due to tongue laceration. On metoprolol and flecainide (Dose increased)   Hypernatremia Improved with fluids.  PVCs Normal potassium and magnesium. Continue telemetry monitoring.     Code Status : Full code  Family Communication  : Spoke with mother on the phone on 4/24. (She has been recently out of hospital herself and  cannot come to visit the patient. Please update her on the phone)   Disposition Plan  : , Transferred to telemetry. Home possibly in 1-2 days if CPK continues to improve and no further seizure activity. Patient lives with his mother at home.  Barriers For Discharge :  IV hydration for elevated CPK, monitor for tongue bleeding and further seizure activity.  Consults  :   Neurology (signed off) ENT (Dr. Pollyann Kennedy)   Procedures  :  EEG MRI brain  DVT Prophylaxis  :  Lovenox -   Lab Results  Component Value Date   PLT 181 03/28/2016    Antibiotics  :  None  Anti-infectives    None        Objective:   Filed Vitals:   03/29/16 0600 03/29/16 0700 03/29/16 0746 03/29/16 1008  BP: 126/92 121/63  136/84  Pulse: 52 91  93  Temp:  100.5 F (38.1 C) 99.2 F (37.3 C)   TempSrc:  Oral Oral   Resp: 18 25    Height:      Weight:      SpO2: 97% 96%      Wt Readings from Last 3 Encounters:  03/27/16 93.7 kg (206 lb 9.1 oz)     Intake/Output Summary (Last 24 hours) at 03/29/16 1109 Last data filed at 03/29/16 0743  Gross per 24 hour  Intake 3547.5 ml  Output   1875 ml  Net 1672.5 ml     Physical Exam  Gen: not in distress HEENT:, Swollen tongue, With some oozing, no active bleeding,, no pallor, moist mucosa, supple neck Chest: clear b/l, no added sounds CVS: N S1&S2, no murmurs, rubs or gallop GI: soft, NT, ND, BS+ Musculoskeletal: warm, no edema CNS: Alert and oriented, nonfocal    Data Review:    CBC  Recent Labs Lab 03/27/16 2004 03/28/16 0554  WBC 13.1* 9.5  HGB 13.3 13.1  HCT 40.9 41.0  PLT 198 181  MCV 71.8* 71.7*  MCH 23.3* 22.9*  MCHC 32.5 32.0  RDW 17.7* 17.4*    Chemistries   Recent Labs Lab 03/27/16 2004 03/28/16 0554 03/29/16 0416  NA  --  139 139  K  --  3.9 3.7  CL  --  105 109  CO2  --  21* 22  GLUCOSE  --  89 91  BUN  --  10 5*  CREATININE 1.36* 1.42* 1.22  CALCIUM  --  8.8* 8.5*  MG  --  2.5*  --   AST  --  34  --     ALT  --  23  --   ALKPHOS  --  94  --   BILITOT  --  0.8  --    ------------------------------------------------------------------------------------------------------------------ No results for input(s): CHOL, HDL, LDLCALC, TRIG, CHOLHDL, LDLDIRECT in the last 72 hours.  No results found for: HGBA1C ------------------------------------------------------------------------------------------------------------------ No results for input(s): TSH, T4TOTAL, T3FREE, THYROIDAB in the last 72 hours.  Invalid input(s): FREET3 ------------------------------------------------------------------------------------------------------------------ No results for input(s): VITAMINB12, FOLATE, FERRITIN, TIBC, IRON, RETICCTPCT in the last 72 hours.  Coagulation profile No results for input(s): INR, PROTIME in the last 168 hours.  No results for input(s): DDIMER in the last 72 hours.  Cardiac Enzymes No results for input(s): CKMB, TROPONINI, MYOGLOBIN in the last 168 hours.  Invalid input(s): CK ------------------------------------------------------------------------------------------------------------------ No results found for: BNP  Inpatient Medications  Scheduled Meds: . amLODipine  5 mg Oral Daily  . antiseptic oral rinse  7 mL Mouth Rinse q12n4p  . chlorhexidine  15 mL Mouth Rinse BID  . docusate sodium  100 mg Oral BID  . enoxaparin (LOVENOX) injection  40 mg Subcutaneous Q24H  . flecainide  50 mg Oral Q12H  . levETIRAcetam  500 mg Intravenous Q12H  . metoprolol tartrate  50 mg Oral BID  . montelukast  10 mg Oral QHS  . sodium chloride flush  3 mL Intravenous Q12H   Continuous Infusions:   PRN Meds:.acetaminophen (TYLENOL) oral liquid 160 mg/5 mL, acetaminophen **OR** acetaminophen, hydrALAZINE, LORazepam, LORazepam, oxyCODONE  Micro Results Recent Results (from the past 240 hour(s))  MRSA PCR Screening     Status: None   Collection Time: 03/27/16  6:40 PM  Result Value Ref Range  Status   MRSA by PCR NEGATIVE NEGATIVE Final    Comment:        The GeneXpert MRSA Assay (FDA approved for NASAL specimens only), is one component of a comprehensive MRSA colonization surveillance program. It is not intended to diagnose MRSA infection nor to guide or monitor treatment for MRSA infections.     Radiology Reports Mr Brain Wo Contrast  03/28/2016  CLINICAL DATA:  Seizure EXAM: MRI HEAD WITHOUT CONTRAST TECHNIQUE: Multiplanar, multiecho pulse sequences of the brain and surrounding structures were obtained without intravenous contrast. COMPARISON:  None. FINDINGS: Image quality degraded by significant motion. Ventricle size normal.  Cerebral volume normal. Negative for acute or chronic infarction. Negative for intracranial hemorrhage.  No fluid collection. Negative for mass or edema.  No shift of the midline structures. Paranasal sinuses clear.  Normal orbit.  Pituitary not enlarged. IMPRESSION: Image quality degraded by significant motion. No significant abnormality detected on this motion degraded study. Electronically Signed   By: Marlan Palau M.D.   On: 03/28/2016 14:20   Dg Chest Port 1 View  03/28/2016  CLINICAL DATA:  NEW ONSET FEVER EXAM: PORTABLE CHEST - 1 VIEW COMPARISON:  None available FINDINGS: Lungs are clear. Heart size and mediastinal contours are within normal limits. No effusion. Visualized skeletal structures are unremarkable. IMPRESSION: No acute cardiopulmonary disease. Electronically Signed   By: Corlis Leak M.D.   On: 03/28/2016 14:54    Time Spent in minutes  25  Eddie North M.D on 03/29/2016 at 11:09 AM  Between 7am to 7pm - Pager - [phone]  After 7pm go to [email or web address] - password [facility]  [facility] -  Office  [phone]

## 2016-03-29 NOTE — Progress Notes (Addendum)
Near 2200 on 03/28/16, Pt's caregiver expressed the pt's mother's concerns that since the pt started taking metoprolol in December 2016, he has been having seizures.  The pt's mother believes that this medication is the cause of the seizures.  On call provider notified and orders to hold metoprolol and to discuss with rounding physician tomorrow were obtained.   At 0300, Pt HR began to spike up to 140s but would lower quickly to 110s.  At 0427, Pt HR elevated to 140-170s.  RN gave metoprolol that had been held. Pt HR now sustaining 90-120s.  Pt states he "feels fine, just ready to go home".  Will continue to monitor patient.

## 2016-03-29 NOTE — Progress Notes (Signed)
Subjective: No further seizures  Exam: Filed Vitals:   03/29/16 0700 03/29/16 0746  BP: 121/63   Pulse: 91   Temp:  99.2 F (37.3 C)  Resp: 25    Gen: In bed, NAD Resp: non-labored breathing, no acute distress Abd: soft, nt  Neuro: MS: awake, alert MO:QHUTML dysarthric(bit tongue) Motor: MAEW   Impression: 40 yo M with third lifetime seizure. MRI and EEG are negative, but given the third event, would start AED. He does not drive.   Recommendations: 1) Keppra 500mg  BID 2) f/u with outpatient neurology.  3) please call with further questions or concerns.   , MD [facility] [phone]  If 7pm- 7am, please page neurology on call as listed in AMION.

## 2016-03-30 LAB — RENAL FUNCTION PANEL
ALBUMIN: 3.1 g/dL — AB (ref 3.5–5.0)
Anion gap: 10 (ref 5–15)
BUN: 6 mg/dL (ref 6–20)
CHLORIDE: 107 mmol/L (ref 101–111)
CO2: 21 mmol/L — ABNORMAL LOW (ref 22–32)
CREATININE: 1.02 mg/dL (ref 0.61–1.24)
Calcium: 8.8 mg/dL — ABNORMAL LOW (ref 8.9–10.3)
GFR calc Af Amer: >60 mL/min (ref >60)
GLUCOSE: 81 mg/dL (ref 65–99)
PHOSPHORUS: 2.7 mg/dL (ref 2.5–4.6)
Potassium: 3.9 mmol/L (ref 3.5–5.1)
Sodium: 138 mmol/L (ref 135–145)

## 2016-03-30 LAB — HEPATIC FUNCTION PANEL
ALK PHOS: 77 U/L (ref 38–126)
ALT: 30 U/L (ref 17–63)
AST: 137 U/L — ABNORMAL HIGH (ref 15–41)
Albumin: 3.1 g/dL — ABNORMAL LOW (ref 3.5–5.0)
BILIRUBIN DIRECT: 0.2 mg/dL (ref 0.1–0.5)
BILIRUBIN INDIRECT: 0.8 mg/dL (ref 0.3–0.9)
TOTAL PROTEIN: 6.4 g/dL — AB (ref 6.5–8.1)
Total Bilirubin: 1 mg/dL (ref 0.3–1.2)

## 2016-03-30 LAB — CK: CK TOTAL: 22652 U/L — AB (ref 49–397)

## 2016-03-30 LAB — CBC
HCT: 37.6 % — ABNORMAL LOW (ref 39.0–52.0)
Hemoglobin: 11.9 g/dL — ABNORMAL LOW (ref 13.0–17.0)
MCH: 22.5 pg — ABNORMAL LOW (ref 26.0–34.0)
MCHC: 31.6 g/dL (ref 30.0–36.0)
MCV: 70.9 fL — ABNORMAL LOW (ref 78.0–100.0)
PLATELETS: 168 10*3/uL (ref 150–400)
RBC: 5.3 MIL/uL (ref 4.22–5.81)
RDW: 17.2 % — AB (ref 11.5–15.5)
WBC: 8.7 10*3/uL (ref 4.0–10.5)

## 2016-03-30 MED ORDER — SODIUM CHLORIDE 0.9 % IV SOLN
INTRAVENOUS | Status: DC
  Administered 2016-03-30 – 2016-03-31 (×3): via INTRAVENOUS
  Administered 2016-03-31: 150 mL/h via INTRAVENOUS
  Administered 2016-03-31: 06:00:00 via INTRAVENOUS
  Administered 2016-04-01: 150 mL/h via INTRAVENOUS

## 2016-03-30 MED ORDER — LEVETIRACETAM 500 MG PO TABS
500.0000 mg | ORAL_TABLET | Freq: Two times a day (BID) | ORAL | Status: DC
  Administered 2016-03-30 – 2016-04-01 (×5): 500 mg via ORAL
  Filled 2016-03-30 (×5): qty 1

## 2016-03-30 NOTE — Progress Notes (Signed)
PROGRESS NOTE                                                                                                                                                                                                             Patient Demographics:    Adrian Carter, is a 40 y.o. male, DOB - 1975-12-16, FOY:[id number]  Admit date - 03/27/2016   Admitting Physician Jeralyn Bennett, MD  Outpatient Primary MD for the patient is No primary care provider on file.  LOS - 3  Outpatient Specialists:  Seizures  Brief Narrative 40 year old male with history of hypertension, hyperlipidemia, autism initially brought to [facility] ED by EMS after he had a generalized tonic-clonic seizures at home. This was witnessed by his mother. Patient doesn't recall the event and mother not sure if he had bowel urinary incontinence. No recent fevers, head trauma. Patient had 2 prior episodes of seizures back in November and December 2016 when he was admitted to [facility]. He had MRI brain done at that time which was unremarkable. He was not started on antiepileptics but was following with a neurologist in [place].  Patient was brought to [facility] ED by EMS. At [facility] ED his vitals were stable and normal CBC. Chemistry showed hypernatremia with sodium of 152, normal renal function, LFTs and negative urine tox. Head CT done was negative for acute intracranial abnormality. Patient had 2 more episodes of witnessed tonic-clonic seizures in the ED. He was given 4 mg of IV Ativan and 1 g of fosphenytoin. Neuro- hospitalist was consulted by [facility] ED and recommended transfer to [facility] for further evaluation. No further seizure activity.  Started on Keppra, EEG and MRI negative Tongue laceration and swelling improving CK still very high requiring aggressive IVF   Subjective:  Feels well, wants to go home, no bleeding from tongue lac   Assessment  & Plan :   Principal problem Generalized tonic-clonic seizures -MRI brain unremarkable. EEG done without epileptiform activity. -Started on Keppra 500 mg twice daily and will be discharged on it. Neurology signed off. -change Keppra to PO -Patient should follow-up with his neurologist upon discharge.  Acute rhabdomyolysis Secondary to seizures, mild AKI resolved with hydration. -CPK further elevated to >22K, should hopefully start trending down over the next day or two since no further seizures -continue normal saline at 150  mL per hour, AST up due to rhabdo -Monitor CPK and bmet daily. -urine output good  Fever -Suspect due to acute seizures. Chest x-ray unremarkable. Check UA and blood culture. Tylenol when necessary for supportive care. -afebrile since, temp 99.7 earlier  Tongue laceration with bleeding, following seizures -due to tongue bite during seizures -ENT consulted, seen by Dr.Rosen yesterday, no need for any intervention, healing and no further bleeding, swelling much improved -advance to soft diet    Essential hypertension Stable. Continue amlodipine. Increased metoprolol dose as patient having frequent PVCs and heart rate elevated earlier this admit, now improved  A. fib Recently diagnosed as per mother. CHADS2 vasc of 1 . On full dose aspirin which is scheduled due to tongue laceration. On metoprolol and flecainide (Dose increased)  Hypernatremia Improved with fluids.  Rare PVCs Normal potassium and magnesium.  -DC tele  Code Status : Full code Family Communication  : called and updated mother by phone today Disposition Plan  : Home in few days, once CK starts improving.  Consults  :   Neurology (signed off) ENT (Dr. Pollyann Kennedy)   Procedures  :  EEG MRI brain  DVT Prophylaxis  :  Lovenox -   Lab Results  Component Value Date   PLT 168 03/30/2016    Antibiotics  :  None  Anti-infectives    None        Objective:   Filed Vitals:     03/29/16 1705 03/29/16 2125 03/30/16 0137 03/30/16 0548  BP: 146/92 144/84 138/89 151/87  Pulse: 83 85 86 85  Temp: 99.5 F (37.5 C) 99.5 F (37.5 C) 98.4 F (36.9 C) 99.7 F (37.6 C)  TempSrc: Oral Oral Oral Oral  Resp: 20 18 18 18   Height:      Weight:      SpO2: 100% 100% 100% 100%    Wt Readings from Last 3 Encounters:  03/27/16 93.7 kg (206 lb 9.1 oz)     Intake/Output Summary (Last 24 hours) at 03/30/16 0942 Last data filed at 03/30/16 0330  Gross per 24 hour  Intake   1630 ml  Output   1725 ml  Net    -95 ml     Physical Exam  Gen: Alert, awake, oriented x3, no distress HEENT:, Swollen tongue, laceration noted, healing by 2ndary intention, no bleeding,, no pallor, moist mucosa, supple neck Chest: CTAB CVS: N S1&S2, no murmurs, rubs or gallop GI: soft, NT, ND, BS+ Musculoskeletal: warm, no edema CNS: non focal    Data Review:    CBC  Recent Labs Lab 03/27/16 2004 03/28/16 0554 03/30/16 0622  WBC 13.1* 9.5 8.7  HGB 13.3 13.1 11.9*  HCT 40.9 41.0 37.6*  PLT 198 181 168  MCV 71.8* 71.7* 70.9*  MCH 23.3* 22.9* 22.5*  MCHC 32.5 32.0 31.6  RDW 17.7* 17.4* 17.2*    Chemistries   Recent Labs Lab 03/27/16 2004 03/28/16 0554 03/29/16 0416 03/30/16 0622  NA  --  139 139 138  K  --  3.9 3.7 3.9  CL  --  105 109 107  CO2  --  21* 22 21*  GLUCOSE  --  89 91 81  BUN  --  10 5* 6  CREATININE 1.36* 1.42* 1.22 1.02  CALCIUM  --  8.8* 8.5* 8.8*  MG  --  2.5*  --   --   AST  --  34  --  137*  ALT  --  23  --  30  ALKPHOS  --  94  --  77  BILITOT  --  0.8  --  1.0   ------------------------------------------------------------------------------------------------------------------ No results for input(s): CHOL, HDL, LDLCALC, TRIG, CHOLHDL, LDLDIRECT in the last 72 hours.  No results found for: HGBA1C ------------------------------------------------------------------------------------------------------------------ No results for input(s): TSH,  T4TOTAL, T3FREE, THYROIDAB in the last 72 hours.  Invalid input(s): FREET3 ------------------------------------------------------------------------------------------------------------------ No results for input(s): VITAMINB12, FOLATE, FERRITIN, TIBC, IRON, RETICCTPCT in the last 72 hours.  Coagulation profile No results for input(s): INR, PROTIME in the last 168 hours.  No results for input(s): DDIMER in the last 72 hours.  Cardiac Enzymes No results for input(s): CKMB, TROPONINI, MYOGLOBIN in the last 168 hours.  Invalid input(s): CK ------------------------------------------------------------------------------------------------------------------ No results found for: BNP  Inpatient Medications  Scheduled Meds: . amLODipine  5 mg Oral Daily  . antiseptic oral rinse  7 mL Mouth Rinse q12n4p  . chlorhexidine  15 mL Mouth Rinse BID  . enoxaparin (LOVENOX) injection  40 mg Subcutaneous Q24H  . flecainide  50 mg Oral Q12H  . levETIRAcetam  500 mg Oral BID  . metoprolol tartrate  50 mg Oral BID  . montelukast  10 mg Oral QHS  . sodium chloride flush  3 mL Intravenous Q12H   Continuous Infusions: . sodium chloride     PRN Meds:.acetaminophen (TYLENOL) oral liquid 160 mg/5 mL, acetaminophen **OR** acetaminophen, hydrALAZINE, LORazepam, LORazepam, oxyCODONE, senna  Micro Results Recent Results (from the past 240 hour(s))  MRSA PCR Screening     Status: None   Collection Time: 03/27/16  6:40 PM  Result Value Ref Range Status   MRSA by PCR NEGATIVE NEGATIVE Final    Comment:        The GeneXpert MRSA Assay (FDA approved for NASAL specimens only), is one component of a comprehensive MRSA colonization surveillance program. It is not intended to diagnose MRSA infection nor to guide or monitor treatment for MRSA infections.     Radiology Reports Mr Brain Wo Contrast  03/28/2016  CLINICAL DATA:  Seizure EXAM: MRI HEAD WITHOUT CONTRAST TECHNIQUE: Multiplanar, multiecho pulse  sequences of the brain and surrounding structures were obtained without intravenous contrast. COMPARISON:  None. FINDINGS: Image quality degraded by significant motion. Ventricle size normal.  Cerebral volume normal. Negative for acute or chronic infarction. Negative for intracranial hemorrhage.  No fluid collection. Negative for mass or edema.  No shift of the midline structures. Paranasal sinuses clear.  Normal orbit.  Pituitary not enlarged. IMPRESSION: Image quality degraded by significant motion. No significant abnormality detected on this motion degraded study. Electronically Signed   By: Marlan Palau M.D.   On: 03/28/2016 14:20   Dg Chest Port 1 View  03/28/2016  CLINICAL DATA:  NEW ONSET FEVER EXAM: PORTABLE CHEST - 1 VIEW COMPARISON:  None available FINDINGS: Lungs are clear. Heart size and mediastinal contours are within normal limits. No effusion. Visualized skeletal structures are unremarkable. IMPRESSION: No acute cardiopulmonary disease. Electronically Signed   By: Corlis Leak M.D.   On: 03/28/2016 14:54    Time Spent in minutes    Ortencia Askari M.D on 03/30/2016 at 9:42 AM  Between 7am to 7pm - Pager - [phone]  After 7pm go to [email or web address] - password [facility]  [facility] -  Office  [phone]

## 2016-03-30 NOTE — Progress Notes (Signed)
md made aware of pts elevated blood work.

## 2016-03-30 NOTE — Care Management Important Message (Signed)
Important Message  Patient Details  Name: Adrian Carter MRN: [id number] Date of Birth: February 20, 1976   Medicare Important Message Given:  Yes    Kermit Balo, RN 03/30/2016, 1:57 PM

## 2016-03-31 DIAGNOSIS — R748 Abnormal levels of other serum enzymes: Secondary | ICD-10-CM

## 2016-03-31 DIAGNOSIS — E785 Hyperlipidemia, unspecified: Secondary | ICD-10-CM

## 2016-03-31 DIAGNOSIS — I10 Essential (primary) hypertension: Secondary | ICD-10-CM

## 2016-03-31 DIAGNOSIS — R509 Fever, unspecified: Secondary | ICD-10-CM

## 2016-03-31 DIAGNOSIS — I480 Paroxysmal atrial fibrillation: Secondary | ICD-10-CM

## 2016-03-31 DIAGNOSIS — E870 Hyperosmolality and hypernatremia: Secondary | ICD-10-CM

## 2016-03-31 LAB — BASIC METABOLIC PANEL
Anion gap: 9 (ref 5–15)
BUN: 8 mg/dL (ref 6–20)
CHLORIDE: 108 mmol/L (ref 101–111)
CO2: 22 mmol/L (ref 22–32)
Calcium: 8.6 mg/dL — ABNORMAL LOW (ref 8.9–10.3)
Creatinine, Ser: 0.96 mg/dL (ref 0.61–1.24)
GFR calc Af Amer: >60 mL/min (ref >60)
GLUCOSE: 85 mg/dL (ref 65–99)
POTASSIUM: 3.9 mmol/L (ref 3.5–5.1)
Sodium: 139 mmol/L (ref 135–145)

## 2016-03-31 LAB — CBC
HCT: 36.2 % — ABNORMAL LOW (ref 39.0–52.0)
HEMOGLOBIN: 11.4 g/dL — AB (ref 13.0–17.0)
MCH: 22.7 pg — AB (ref 26.0–34.0)
MCHC: 31.5 g/dL (ref 30.0–36.0)
MCV: 72.1 fL — AB (ref 78.0–100.0)
Platelets: 176 10*3/uL (ref 150–400)
RBC: 5.02 MIL/uL (ref 4.22–5.81)
RDW: 17.3 % — ABNORMAL HIGH (ref 11.5–15.5)
WBC: 7.3 10*3/uL (ref 4.0–10.5)

## 2016-03-31 LAB — CK: Total CK: 21018 U/L — ABNORMAL HIGH (ref 49–397)

## 2016-03-31 NOTE — Progress Notes (Signed)
PROGRESS NOTE                                                                                                                                                                                                             Patient Demographics:    Adrian Carter, is a 40 y.o. male, DOB - Apr 12, 1976, ZOX:[id number]  Admit date - 03/27/2016   Admitting Physician Jeralyn Bennett, MD  Outpatient Primary MD for the patient is No primary care provider on file.  LOS - 4  Outpatient Specialists:  Seizures  Brief Narrative 40 year old male with history of hypertension, hyperlipidemia, autism initially brought to [facility] ED by EMS after he had a generalized tonic-clonic seizures at home. This was witnessed by his mother. Patient had 2 prior episodes of seizures back in November and December 2016 when he was admitted to [facility]. He had MRI brain done at that time which was unremarkable. He was not started on antiepileptics but was following with a neurologist in [place].Patient was brought to [facility] ED by EMS. At [facility] ED his vitals were stable and normal CBC. Chemistry showed hypernatremia with sodium of 152, normal renal function, LFTs and negative urine tox. Head CT done was negative for acute intracranial abnormality.Patient had 2 more episodes of witnessed tonic-clonic seizures in the ED. He was given 4 mg of IV Ativan and 1 g of fosphenytoin.Neuro- hospitalist was consulted by [facility] ED and recommended transfer to [facility] for further evaluation. No further seizure activity.  Started on Keppra, EEG and MRI negative Tongue laceration and swelling improving CK still very high requiring aggressive IVF   Subjective:  Patient seen and examined, no chest pain or shortness of breath.    Assessment  & Plan :   Principal problem Generalized tonic-clonic seizures -MRI brain unremarkable. EEG done without  epileptiform activity. -Started on Keppra 500 mg twice daily and will be discharged on it. Neurology signed off. -change Keppra to PO -Patient should follow-up with his neurologist upon discharge.  Acute rhabdomyolysis Secondary to seizures, mild AKI resolved with hydration. -CPK further elevated to >22K,This morning CPK is 21018.. -continue normal saline at 150 mL per hour, AST 137 up due to rhabdo -Monitor CPK and bmet daily. -urine output good  Fever -Suspect due to acute seizures. Chest x-ray unremarkable. Check UA and blood culture. Tylenol  when necessary for supportive care. -afebrile since, temp 97.8 today  Tongue laceration with bleeding, following seizures -due to tongue bite during seizures -ENT consulted, seen by Dr.Rosen yesterday, no need for any intervention, healing and no further bleeding, swelling much improved -advanced to soft diet    Essential hypertension Stable. Continue amlodipine. Increased metoprolol dose as patient having frequent PVCs and heart rate elevated earlier this admit, now improved  A. fib Recently diagnosed as per mother. CHADS2 vasc of 1 . On full dose aspirin which is scheduled due to tongue laceration. On metoprolol and flecainide (Dose increased)  Hypernatremia Improved with fluids. - Coated sodium 139  Rare PVCs Normal potassium and magnesium.  -DC tele  Code Status : Full code Family Communication  : Discussed with patient's caregiver bedside  Disposition Plan  : Home in few days, once CK starts improving.  Consults  :   Neurology (signed off) ENT (Dr. Pollyann Kennedy)   Procedures  :  EEG MRI brain  DVT Prophylaxis  :  Lovenox -   Lab Results  Component Value Date   PLT 176 03/31/2016    Antibiotics  :  None  Anti-infectives    None        Objective:   Filed Vitals:   03/30/16 2330 03/31/16 0209 03/31/16 0555 03/31/16 0955  BP: 150/89 156/91 147/89 150/86  Pulse:  80 87 87  Temp:  99.1 F (37.3 C) 99.1 F (37.3  C) 97.8 F (36.6 C)  TempSrc:  Oral Oral Oral  Resp:  19 18 20   Height:      Weight:      SpO2:  99% 99% 98%    Wt Readings from Last 3 Encounters:  03/27/16 93.7 kg (206 lb 9.1 oz)     Intake/Output Summary (Last 24 hours) at 03/31/16 1303 Last data filed at 03/31/16 1200  Gross per 24 hour  Intake   3850 ml  Output   1700 ml  Net   2150 ml     Physical Exam  Gen: Alert, awake, oriented x3, no distress HEENT:, Swollen tongue, Discoloration noted Chest: Clear to auscultation bilaterally, no wheezing or crackles  CVS: N S1&S2, no murmurs, rubs or gallop GI: soft, nontender, no organomegaly.  Musculoskeletal: warm, no edema     Data Review:    CBC  Recent Labs Lab 03/27/16 2004 03/28/16 0554 03/30/16 0622 03/31/16 0641  WBC 13.1* 9.5 8.7 7.3  HGB 13.3 13.1 11.9* 11.4*  HCT 40.9 41.0 37.6* 36.2*  PLT 198 181 168 176  MCV 71.8* 71.7* 70.9* 72.1*  MCH 23.3* 22.9* 22.5* 22.7*  MCHC 32.5 32.0 31.6 31.5  RDW 17.7* 17.4* 17.2* 17.3*    Chemistries   Recent Labs Lab 03/27/16 2004 03/28/16 0554 03/29/16 0416 03/30/16 0622 03/31/16 0641  NA  --  139 139 138 139  K  --  3.9 3.7 3.9 3.9  CL  --  105 109 107 108  CO2  --  21* 22 21* 22  GLUCOSE  --  89 91 81 85  BUN  --  10 5* 6 8  CREATININE 1.36* 1.42* 1.22 1.02 0.96  CALCIUM  --  8.8* 8.5* 8.8* 8.6*  MG  --  2.5*  --   --   --   AST  --  34  --  137*  --   ALT  --  23  --  30  --   ALKPHOS  --  94  --  77  --  BILITOT  --  0.8  --  1.0  --    ------------------------------------------------------------------------------------------------------------------  ------------------------------------------------------------------------------------------------------------------ No results found for: BNP  Inpatient Medications  Scheduled Meds: . amLODipine  5 mg Oral Daily  . antiseptic oral rinse  7 mL Mouth Rinse q12n4p  . chlorhexidine  15 mL Mouth Rinse BID  . enoxaparin (LOVENOX) injection  40  mg Subcutaneous Q24H  . flecainide  50 mg Oral Q12H  . levETIRAcetam  500 mg Oral BID  . metoprolol tartrate  50 mg Oral BID  . montelukast  10 mg Oral QHS  . sodium chloride flush  3 mL Intravenous Q12H   Continuous Infusions: . sodium chloride 150 mL/hr at 03/31/16 0602   PRN Meds:.acetaminophen (TYLENOL) oral liquid 160 mg/5 mL, acetaminophen **OR** acetaminophen, hydrALAZINE, LORazepam, LORazepam, oxyCODONE, senna  Micro Results Recent Results (from the past 240 hour(s))  MRSA PCR Screening     Status: None   Collection Time: 03/27/16  6:40 PM  Result Value Ref Range Status   MRSA by PCR NEGATIVE NEGATIVE Final    Comment:        The GeneXpert MRSA Assay (FDA approved for NASAL specimens only), is one component of a comprehensive MRSA colonization surveillance program. It is not intended to diagnose MRSA infection nor to guide or monitor treatment for MRSA infections.   Culture, blood (routine x 2)     Status: None (Preliminary result)   Collection Time: 03/29/16  9:10 AM  Result Value Ref Range Status   Specimen Description BLOOD RIGHT ANTECUBITAL  Final   Special Requests BOTTLES DRAWN AEROBIC AND ANAEROBIC 5CC  Final   Culture NO GROWTH 2 DAYS  Final   Report Status PENDING  Incomplete  Culture, blood (routine x 2)     Status: None (Preliminary result)   Collection Time: 03/29/16  9:15 AM  Result Value Ref Range Status   Specimen Description BLOOD RIGHT HAND  Final   Special Requests BOTTLES DRAWN AEROBIC AND ANAEROBIC 5CC  Final   Culture NO GROWTH 2 DAYS  Final   Report Status PENDING  Incomplete    Radiology Reports Mr Brain Wo Contrast  03/28/2016  CLINICAL DATA:  Seizure EXAM: MRI HEAD WITHOUT CONTRAST TECHNIQUE: Multiplanar, multiecho pulse sequences of the brain and surrounding structures were obtained without intravenous contrast. COMPARISON:  None. FINDINGS: Image quality degraded by significant motion. Ventricle size normal.  Cerebral volume normal.  Negative for acute or chronic infarction. Negative for intracranial hemorrhage.  No fluid collection. Negative for mass or edema.  No shift of the midline structures. Paranasal sinuses clear.  Normal orbit.  Pituitary not enlarged. IMPRESSION: Image quality degraded by significant motion. No significant abnormality detected on this motion degraded study. Electronically Signed   By: Marlan Palau M.D.   On: 03/28/2016 14:20   Dg Chest Port 1 View  03/28/2016  CLINICAL DATA:  NEW ONSET FEVER EXAM: PORTABLE CHEST - 1 VIEW COMPARISON:  None available FINDINGS: Lungs are clear. Heart size and mediastinal contours are within normal limits. No effusion. Visualized skeletal structures are unremarkable. IMPRESSION: No acute cardiopulmonary disease. Electronically Signed   By: Corlis Leak M.D.   On: 03/28/2016 14:54    Time Spent in minutes    Trusten Hume S M.D on 03/31/2016 at 1:03 PM  Between 7am to 7pm - Pager - [phone] - [phone]  After 7pm go to [email or web address] - password [facility]  [facility] -  Office  [phone]

## 2016-04-01 LAB — CK: Total CK: 11367 U/L — ABNORMAL HIGH (ref 49–397)

## 2016-04-01 MED ORDER — LEVETIRACETAM 500 MG PO TABS: 500.0000 mg | ORAL_TABLET | Freq: Two times a day (BID) | ORAL | Status: DC

## 2016-04-01 MED ORDER — FLECAINIDE ACETATE 50 MG PO TABS: 100.0000 mg | ORAL_TABLET | Freq: Two times a day (BID) | ORAL | Status: DC

## 2016-04-01 MED ORDER — AMLODIPINE BESYLATE 10 MG PO TABS: 10.0000 mg | ORAL_TABLET | Freq: Every day | ORAL | Status: DC

## 2016-04-01 MED ORDER — METOPROLOL TARTRATE 50 MG PO TABS: 50.0000 mg | ORAL_TABLET | Freq: Two times a day (BID) | ORAL | Status: DC

## 2016-04-01 NOTE — Discharge Instructions (Signed)
Do not drive, operating heavy machinery, perform activities at heights, swimming or participation in water activities or provide baby sitting services Neurologist and advised to do so again.  Follow with Primary MD in 2-3 days, your cardiologist within a week   Get CBC, CMP, CK, checked  by Primary MD next visit.   Activity: As tolerated with Full fall precautions use walker/cane & assistance as needed  Disposition Home    Diet:   Heart Healthy     On your next visit with your primary care physician please Get Medicines reviewed and adjusted.   Please request your Prim.MD to go over all Hospital Tests and Procedure/Radiological results at the follow up, please get all Hospital records sent to your Prim MD by signing hospital release before you go home.   If you experience worsening of your admission symptoms, develop shortness of breath, life threatening emergency, suicidal or homicidal thoughts you must seek medical attention immediately by calling 911 or calling your MD immediately  if symptoms less severe.  You Must read complete instructions/literature along with all the possible adverse reactions/side effects for all the Medicines you take and that have been prescribed to you. Take any new Medicines after you have completely understood and accpet all the possible adverse reactions/side effects.   Do not drive when taking Pain medications.   Do not take more than prescribed Pain, Sleep and Anxiety Medications  Special Instructions: If you have smoked or chewed Tobacco  in the last 2 yrs please stop smoking, stop any regular Alcohol  and or any Recreational drug use.  Wear Seat belts while driving.   Please note  You were cared for by a hospitalist during your hospital stay. If you have any questions about your discharge medications or the care you received while you were in the hospital after you are discharged, you can call the unit and asked to speak with the hospitalist on  call if the hospitalist that took care of you is not available. Once you are discharged, your primary care physician will handle any further medical issues. Please note that NO REFILLS for any discharge medications will be authorized once you are discharged, as it is imperative that you return to your primary care physician (or establish a relationship with a primary care physician if you do not have one) for your aftercare needs so that they can reassess your need for medications and monitor your lab values.

## 2016-04-01 NOTE — Progress Notes (Signed)
Discharge orders received.  Discharge instructions and follow-up appointments reviewed with the patient and his caregiver.  VSS upon discharge.  Education complete.  Explained that no new prescriptions were given Except for keppra.  All belongings sent with the patient.  Transported out via wheelchair.  Sondra Come, RN

## 2016-04-01 NOTE — Discharge Summary (Signed)
Adrian Carter, is a 40 y.o. male  DOB 17-Sep-1976  MRN [id number].  Admission date:  03/27/2016  Admitting Physician  Jeralyn Bennett, MD  Discharge Date:  04/01/2016   Primary MD  No primary care provider on file.  Recommendations for primary care physician for things to follow:   Please check CBC, CMP and CK levels in 2-3 days. Outpatient neurology and cardiology follow-up within 1-2 weeks   Admission Diagnosis  SEIZURES   Discharge Diagnosis  SEIZURES    Active Problems:   Seizure (HCC)   Hyperlipidemia   Essential hypertension   Insomnia   Hypernatremia   Seizures (HCC)   Fever   Elevated CPK   Tongue laceration   Paroxysmal a-fib (HCC)      Past Medical History  Diagnosis Date  . Seizure (HCC)   . Autism   . PAF (paroxysmal atrial fibrillation) [facility])     Past Surgical History  Procedure Laterality Date  . Tonsillectomy and adenoidectomy    . Foot surgery         HPI  from the history and physical done on the day of admission:    40 year old male with history of hypertension, hyperlipidemia, autism initially brought to [facility] ED by EMS after he had a generalized tonic-clonic seizures at home. This was witnessed by his mother. Patient had 2 prior episodes of seizures back in November and December 2016 when he was admitted to [facility]. He had MRI brain done at that time which was unremarkable. He was not started on antiepileptics but was following with a neurologist in [place].Patient was brought to [facility] ED by EMS. At [facility] ED his vitals were stable and normal CBC. Chemistry showed hypernatremia with sodium of 152, normal renal function, LFTs and negative urine tox. Head CT done was negative for acute intracranial abnormality.Patient had 2 more episodes of  witnessed tonic-clonic seizures in the ED. He was given 4 mg of IV Ativan and 1 g of fosphenytoin.Neuro- hospitalist was consulted by [facility] ED and recommended transfer to [facility] for further evaluation. No further seizure activity.   Started on Keppra, EEG and MRI negative, Tongue laceration and swelling improving. CK much better at 11K from 21 K.    Hospital Course:     Generalized tonic-clonic seizures -MRI brain unremarkable. EEG done without epileptiform activity. He was seen by neurology and placed on 500 twice a day of Keppra. Now stable and seizure free, cleared by neurology for home discharge with outpatient follow-up. Written instructions for driving given.  Acute rhabdomyolysis Secondary to seizures, mild AKI resolved with hydration. He was hydrated, CK levels down to 11,000 today. Patient and girlfriend recommended to stay well hydrated at home. Request PCP to check CBC, CMP and CK levels in 2-3 days. Excellent urine output.  Fever -Suspect due to acute seizures. Chest x-ray unremarkable. Check UA and blood culture. Fever free and nontoxic. Tongue   Laceration with bleeding, following seizures -due to tongue bite during seizures, seen by Dr. Pollyann Kennedy ENT.  No intervention. Laceration much improved. Tolerating diet symptom free.  Essential hypertension Blood pressure in poor control, doubled Lopressor dose and added Norvasc for better control. PCP to monitor.  Paroxysmal A. fib Recently diagnosed as per mother. CHADS2 vasc of 1, on aspirin along with beta blocker and  Flecainide. Doubled beta blocker dose due to high blood pressure, requested to follow with primary cardiologist within a week.   Hypernatremia Improved with fluids. Repeat CMP in  2-3 days.    Follow UP  Follow-up Information    Follow up with [facility]. Schedule an appointment as soon as possible for a visit in 1 week.   Contact information:   [address]     [address] [place] [address] [phone]      Follow up with PCP. Schedule an appointment as soon as possible for a visit in 2 days.   Why:  Get CBC, CMP and CK levels checked in 2 days. Must Your cardiologist within a week       Consults obtained - Neuro  Discharge Condition:  Stable  Diet and Activity recommendation: See Discharge Instructions below  Discharge Instructions           Discharge Instructions    Diet - low sodium heart healthy    Complete by:  As directed      Discharge instructions    Complete by:  As directed   Do not drive, operating heavy machinery, perform activities at heights, swimming or participation in water activities or provide baby sitting services Neurologist and advised to do so again.  Follow with Primary MD in 2-3 days , your cardiologist within a week  Get CBC, CMP, CK, checked  by Primary MD next visit.   Activity: As tolerated with Full fall precautions use walker/cane & assistance as needed  Disposition Home    Diet:   Heart Healthy     On your next visit with your primary care physician please Get Medicines reviewed and adjusted.   Please request your Prim.MD to go over all Hospital Tests and Procedure/Radiological results at the follow up, please get all Hospital records sent to your Prim MD by signing hospital release before you go home.   If you experience worsening of your admission symptoms, develop shortness of breath, life threatening emergency, suicidal or homicidal thoughts you must seek medical attention immediately by calling 911 or calling your MD immediately  if symptoms less severe.  You Must read complete instructions/literature along with all the possible adverse reactions/side effects for all the Medicines you take and that have been prescribed to you. Take any new Medicines after you have completely understood and accpet all the possible adverse reactions/side effects.   Do not drive when taking Pain  medications.   Do not take more than prescribed Pain, Sleep and Anxiety Medications  Special Instructions: If you have smoked or chewed Tobacco  in the last 2 yrs please stop smoking, stop any regular Alcohol  and or any Recreational drug use.  Wear Seat belts while driving.   Please note  You were cared for by a hospitalist during your hospital stay. If you have any questions about your discharge medications or the care you received while you were in the hospital after you are discharged, you can call the unit and asked to speak with the hospitalist on call if the hospitalist that took care of you is not available. Once you are discharged, your primary care physician will  handle any further medical issues. Please note that NO REFILLS for any discharge medications will be authorized once you are discharged, as it is imperative that you return to your primary care physician (or establish a relationship with a primary care physician if you do not have one) for your aftercare needs so that they can reassess your need for medications and monitor your lab values.     Increase activity slowly    Complete by:  As directed              Discharge Medications       Medication List    TAKE these medications        amLODipine 10 MG tablet  Commonly known as:  NORVASC  Take 1 tablet (10 mg total) by mouth daily.  Start taking on:  04/02/2016     aspirin EC 325 MG tablet  Take 325 mg by mouth 2 (two) times daily.     flecainide 50 MG tablet  Commonly known as:  TAMBOCOR  Take 2 tablets (100 mg total) by mouth 2 (two) times daily.     levETIRAcetam 500 MG tablet  Commonly known as:  KEPPRA  Take 1 tablet (500 mg total) by mouth 2 (two) times daily.     LORazepam 1 MG tablet  Commonly known as:  ATIVAN  Take 1 mg by mouth every 8 (eight) hours as needed for anxiety.     metoprolol 50 MG tablet  Commonly known as:  LOPRESSOR  Take 1 tablet (50 mg total) by mouth 2 (two) times daily.       montelukast 10 MG tablet  Commonly known as:  SINGULAIR  Take 10 mg by mouth at bedtime.     oxyCODONE 15 MG immediate release tablet  Commonly known as:  ROXICODONE  Take 15 mg by mouth every 8 (eight) hours as needed for pain.     pravastatin 20 MG tablet  Commonly known as:  PRAVACHOL  Take 20 mg by mouth at bedtime.        Major procedures and Radiology Reports - PLEASE review detailed and final reports for all details, in brief -   EEG  This is a normal drowsy and asleep EEG for the patients stated age. During the very brief portion in which the patient was most awake, there was a normal waking rhythm. There was an excessive amount of fast (beta) activity which is usually due to medication effect, such as benzodiazepines. There were no focal, hemispheric or lateralizing features. No epileptiform activity was recorded. A normal EEG does not exclude the diagnosis of a seizure disorder and if seizure remains high on the list of differential diagnosis, an ambulatory EEG may be of value. Clinical correlation is required.  Mr Brain Wo Contrast  03/28/2016  CLINICAL DATA:  Seizure EXAM: MRI HEAD WITHOUT CONTRAST TECHNIQUE: Multiplanar, multiecho pulse sequences of the brain and surrounding structures were obtained without intravenous contrast. COMPARISON:  None. FINDINGS: Image quality degraded by significant motion. Ventricle size normal.  Cerebral volume normal. Negative for acute or chronic infarction. Negative for intracranial hemorrhage.  No fluid collection. Negative for mass or edema.  No shift of the midline structures. Paranasal sinuses clear.  Normal orbit.  Pituitary not enlarged. IMPRESSION: Image quality degraded by significant motion. No significant abnormality detected on this motion degraded study. Electronically Signed   By: Marlan Palau M.D.   On: 03/28/2016 14:20   Dg Chest Port 1 View  03/28/2016  CLINICAL  DATA:  NEW ONSET FEVER EXAM: PORTABLE CHEST - 1 VIEW  COMPARISON:  None available FINDINGS: Lungs are clear. Heart size and mediastinal contours are within normal limits. No effusion. Visualized skeletal structures are unremarkable. IMPRESSION: No acute cardiopulmonary disease. Electronically Signed   By: Corlis Leak M.D.   On: 03/28/2016 14:54    Micro Results      Recent Results (from the past 240 hour(s))  MRSA PCR Screening     Status: None   Collection Time: 03/27/16  6:40 PM  Result Value Ref Range Status   MRSA by PCR NEGATIVE NEGATIVE Final    Comment:        The GeneXpert MRSA Assay (FDA approved for NASAL specimens only), is one component of a comprehensive MRSA colonization surveillance program. It is not intended to diagnose MRSA infection nor to guide or monitor treatment for MRSA infections.   Culture, blood (routine x 2)     Status: None (Preliminary result)   Collection Time: 03/29/16  9:10 AM  Result Value Ref Range Status   Specimen Description BLOOD RIGHT ANTECUBITAL  Final   Special Requests BOTTLES DRAWN AEROBIC AND ANAEROBIC 5CC  Final   Culture NO GROWTH 2 DAYS  Final   Report Status PENDING  Incomplete  Culture, blood (routine x 2)     Status: None (Preliminary result)   Collection Time: 03/29/16  9:15 AM  Result Value Ref Range Status   Specimen Description BLOOD RIGHT HAND  Final   Special Requests BOTTLES DRAWN AEROBIC AND ANAEROBIC 5CC  Final   Culture NO GROWTH 2 DAYS  Final   Report Status PENDING  Incomplete    Today   Subjective    Adrian Carter today has no headache,no chest abdominal pain,no new weakness tingling or numbness, feels much better wants to go home today.     Objective   Blood pressure 170/90, pulse 82, temperature 98.7 F (37.1 C), temperature source Oral, resp. rate 20, height 6\' 1"  (1.854 m), weight 93.7 kg (206 lb 9.1 oz), SpO2 98 %.   Intake/Output Summary (Last 24 hours) at 04/01/16 1041 Last data filed at 04/01/16 0640  Gross per 24 hour  Intake   1320 ml  Output    2150 ml  Net   -830 ml    Exam Awake Alert, Oriented x 3, No new F.N deficits, Normal affect Seibert.AT,PERRAL, Left-sided tongue laceration is healing well Supple Neck,No JVD, No cervical lymphadenopathy appriciated.  Symmetrical Chest wall movement, Good air movement bilaterally, CTAB RRR,No Gallops,Rubs or new Murmurs, No Parasternal Heave +ve B.Sounds, Abd Soft, Non tender, No organomegaly appriciated, No rebound -guarding or rigidity. No Cyanosis, Clubbing or edema, No new Rash or bruise   Data Review   CBC w Diff:  Lab Results  Component Value Date   WBC 7.3 03/31/2016   HGB 11.4* 03/31/2016   HCT 36.2* 03/31/2016   PLT 176 03/31/2016    CMP:  Lab Results  Component Value Date   NA 139 03/31/2016   K 3.9 03/31/2016   CL 108 03/31/2016   CO2 22 03/31/2016   BUN 8 03/31/2016   CREATININE 0.96 03/31/2016   PROT 6.4* 03/30/2016   ALBUMIN 3.1* 03/30/2016   ALBUMIN 3.1* 03/30/2016   BILITOT 1.0 03/30/2016   ALKPHOS 77 03/30/2016   AST 137* 03/30/2016   ALT 30 03/30/2016  .   Total Time in preparing paper work, data evaluation and todays exam - 35 minutes  Leroy Sea M.D on 04/01/2016  at 10:41 AM  [facility]   Office  [phone]

## 2016-04-03 LAB — CULTURE, BLOOD (ROUTINE X 2)
CULTURE: NO GROWTH
Culture: NO GROWTH

## 2016-04-25 ENCOUNTER — Ambulatory Visit: Payer: Medicare Other | Admitting: Neurology

## 2016-04-25 ENCOUNTER — Telehealth: Payer: Self-pay

## 2016-04-25 NOTE — Telephone Encounter (Signed)
Patient was a no-show for appointment today

## 2016-04-26 ENCOUNTER — Encounter: Payer: Self-pay | Admitting: Neurology

## 2016-10-19 ENCOUNTER — Encounter: Payer: Self-pay | Admitting: Neurology

## 2016-10-19 ENCOUNTER — Ambulatory Visit (INDEPENDENT_AMBULATORY_CARE_PROVIDER_SITE_OTHER): Payer: Medicare Other | Admitting: Neurology

## 2016-10-19 VITALS — BP 124/80 | HR 68 | Ht 73.0 in | Wt 208.2 lb

## 2016-10-19 DIAGNOSIS — F840 Autistic disorder: Secondary | ICD-10-CM

## 2016-10-19 DIAGNOSIS — G40209 Localization-related (focal) (partial) symptomatic epilepsy and epileptic syndromes with complex partial seizures, not intractable, without status epilepticus: Secondary | ICD-10-CM

## 2016-10-19 MED ORDER — LEVETIRACETAM 1000 MG PO TABS: ORAL_TABLET | ORAL | 6 refills | Status: DC

## 2016-10-19 NOTE — Patient Instructions (Signed)
1. Increase Keppra 1000mg : take 1.5 tablets twice a day 2. Records from Dr. will be requested for review. Depending on what studies have been done before, we will contact you if additional tests are needed. 3. Follow-up in 3 months, call for any changes  Seizure Precautions: 1. If medication has been prescribed for you to prevent seizures, take it exactly as directed.  Do not stop taking the medicine without talking to your doctor first, even if you have not had a seizure in a long time.   2. Avoid activities in which a seizure would cause danger to yourself or to others.  Don't operate dangerous machinery, swim alone, or climb in high or dangerous places, such as on ladders, roofs, or girders.  Do not drive unless your doctor says you may.  3. If you have any warning that you may have a seizure, lay down in a safe place where you can't hurt yourself.    4.  No driving for 6 months from last seizure, as per [facility].   Please refer to the following link on the Epilepsy [employer] of America's website for more information: http://www.epilepsyfoundation.org/answerplace/Social/driving/drivingu.cfm   5.  Maintain good sleep hygiene. Avoid alcohol.  6.  Contact your doctor if you have any problems that may be related to the medicine you are taking.  7.  Call 911 and bring the patient back to the ED if:        A.  The seizure lasts longer than 5 minutes.       B.  The patient doesn't awaken shortly after the seizure  C.  The patient has new problems such as difficulty seeing, speaking or moving  D.  The patient was injured during the seizure  E.  The patient has a temperature over 102 F (39C)  F.  The patient vomited and now is having trouble breathing

## 2016-10-19 NOTE — Progress Notes (Signed)
NEUROLOGY CONSULTATION NOTE  Adrian Carter MRN: [id number] DOB: Feb 01, 1976  Referring provider: Dr. Theodoro Grist  Primary care provider: Dr. Irena Reichmann  Reason for consult:  seizures  Dear Dr Theodoro Grist:  Thank you for your kind referral of Adrian Carter for consultation of the above symptoms. Although his history is well known to you, please allow me to reiterate it for the purpose of our medical record. The patient was accompanied to the clinic by his caregiver who has only been with him for the past 2 weeks. Records and images were personally reviewed where available.  HISTORY OF PRESENT ILLNESS: This is a 40 year old left-handed man with a history of hypertension, hyperlipidemia, atrial fibrillation, autism, presenting to establish care for his seizures. He is a poor historian and is unable to say when exactly seizures started. From records reviewed, when he first saw his PCP in March 2017, his mother reported 3 seizures in his lifetime. He was taking lorazepam 1mg  BID at that time. He was admitted in April 2017 to [facility] after he had several seizures in one day. He is amnestic of events, but denied any fevers or head trauma at that time. He had been seeing neurologist Dr. [facility] until he closed his practice. On hospital discharge, he was given lorazepam 1mg  BID which he appears to have been taking regularly, and Keppra 500mg  BID. They report that the Ativan has been switched to Valium for agitation. He reports taking Keppra 1000mg  BID for the past 4-5 months (caregiver states she checked bottle before visit). They continue to report recurrent seizures, he had a cluster of 4 seizures last weekend. He reports vomiting after the seizures. He reports his left side feels weaker after, and would occasionally notices a burning smell. His caregiver has not witnessed any staring or unresponsive episodes. He denies any myoclonic jerks.   He has occasional throbbing headaches in the occipital region, at times  with associated nausea. He reports dizziness with a spinning sensation occurring every other day when he gets out of bed, lasting about an hour. He reports horizontal diplopia. He has low back pain. No bowel/bladder dysfunction. He reports falling down steps on to his left arm, with continued throbbing pain in the left arm.   Epilepsy Risk Factors:  His paternal uncle had seizures. Patient has autism. As far as he knows, there is no history of febrile convulsions, CNS infections such as meningitis/encephalitis, significant traumatic brain injury, neurosurgical procedures.  Diagnostic Data: I personally reviewed MRI brain without contrast done 03/28/16 which was degraded by motion, no acute changes seen.  EEG done 03/28/16 was normal. There was excess diffuse beta activity seen.  PAST MEDICAL HISTORY: Past Medical History:  Diagnosis Date  . Autism   . PAF (paroxysmal atrial fibrillation) (HCC)   . Seizure (HCC)     PAST SURGICAL HISTORY: Past Surgical History:  Procedure Laterality Date  . FOOT SURGERY    . TONSILLECTOMY AND ADENOIDECTOMY      MEDICATIONS:  Outpatient Encounter Prescriptions as of 10/19/2016  Medication Sig Note  . amLODipine (NORVASC) 10 MG tablet Take 1 tablet (10 mg total) by mouth daily.   aspirin EC 325 MG tablet Take 325 mg by mouth 2 (two) times daily.   . diazepam (VALIUM) 10 MG tablet Take one tablet twice per day as needed for anxiety 10/19/2016: Received from: [facility]  . flecainide (TAMBOCOR) 50 MG tablet Take 2 tablets (100 mg total) by mouth 2 (two) times  daily.   . metoprolol tartrate (LOPRESSOR) 50 MG tablet Take 1 tablet (50 mg total) by mouth 2 (two) times daily.   . montelukast (SINGULAIR) 10 MG tablet Take 10 mg by mouth at bedtime.   Marland Kitchen oxyCODONE (ROXICODONE) 15 MG immediate release tablet Take 15 mg by mouth every 8 (eight) hours as needed for pain.   . pravastatin (PRAVACHOL) 20 MG tablet Take 20 mg by mouth at bedtime.    . [DISCONTINUED] levETIRAcetam (KEPPRA) 500 MG tablet Take 1 tablet (500 mg total) by mouth 2 (two) times daily. 10/19/2016: 1000mg  bid  . levETIRAcetam (KEPPRA) 1000 MG tablet Take 1 & 1/2 tablets twice a day   . [DISCONTINUED] LORazepam (ATIVAN) 1 MG tablet Take 1 mg by mouth every 8 (eight) hours as needed for anxiety.    No facility-administered encounter medications on file as of 10/19/2016.     ALLERGIES: Allergies  Allergen Reactions  . Morphine And Related Anaphylaxis  . Penicillins Anaphylaxis    Has patient had a PCN reaction causing immediate rash, facial/tongue/throat swelling, SOB or lightheadedness with hypotension: No Has patient had a PCN reaction causing severe rash involving mucus membranes or skin necrosis: No Has patient had a PCN reaction that required hospitalization No Has patient had a PCN reaction occurring within the last 10 years: No If all of the above answers are "NO", then may proceed with Cephalosporin use.    FAMILY HISTORY: Family History  Problem Relation Age of Onset  . Hypertension Mother     SOCIAL HISTORY: Social History   Social History  . Marital status: Single    Spouse name: N/A  . Number of children: N/A  . Years of education: N/A   Occupational History  . unemployed    Social History Main Topics  . Smoking status: Never Smoker  . Smokeless tobacco: Never Used  . Alcohol use No  . Drug use: No  . Sexual activity: No   Other Topics Concern  . Not on file   Social History Narrative  . No narrative on file    REVIEW OF SYSTEMS: Constitutional: No fevers, chills, or sweats, no generalized fatigue, change in appetite Eyes: No visual changes, double vision, eye pain Ear, nose and throat: No hearing loss, ear pain, nasal congestion, sore throat Cardiovascular: No chest pain, palpitations Respiratory:  No shortness of breath at rest or with exertion, wheezes GastrointestinaI: No nausea, vomiting, diarrhea, abdominal pain,  fecal incontinence Genitourinary:  No dysuria, urinary retention or frequency Musculoskeletal:  No neck pain, back pain Integumentary: No rash, pruritus, skin lesions Neurological: as above Psychiatric: No depression, insomnia, anxiety Endocrine: No palpitations, fatigue, diaphoresis, mood swings, change in appetite, change in weight, increased thirst Hematologic/Lymphatic:  No anemia, purpura, petechiae. Allergic/Immunologic: no itchy/runny eyes, nasal congestion, recent allergic reactions, rashes  PHYSICAL EXAM: Vitals:   10/19/16 1033  BP: 124/80  Pulse: 68   General: No acute distress Head:  Normocephalic/atraumatic Eyes: Fundoscopic exam shows bilateral sharp discs, no vessel changes, exudates, or hemorrhages Neck: supple, no paraspinal tenderness, full range of motion Back: No paraspinal tenderness Heart: regular rate and rhythm Lungs: Clear to auscultation bilaterally. Vascular: No carotid bruits. Skin/Extremities: No rash, no edema Neurological Exam: Mental status: alert and oriented to person, place, and time, no dysarthria or aphasia, Fund of knowledge is appropriate.  Recent and remote memory are intact.  Attention and concentration are normal.    Able to name objects and repeat phrases. Cranial nerves: CN I: not tested CN  II: pupils equal, round and reactive to light, visual fields intact, fundi unremarkable. CN III, IV, VI:  full range of motion, no nystagmus, no ptosis CN V: decreased pin and cold on left V1-3 CN VII: upper and lower face symmetric CN VIII: hearing intact to finger rub CN IX, X: gag intact, uvula midline CN XI: sternocleidomastoid and trapezius muscles intact CN XII: tongue midline Bulk & Tone: normal, no fasciculations. Motor: 5/5 throughout with no pronator drift. Sensation: decreased pin and cold on left UE and LE, intact vibration and joint position sense. Romberg test negative Deep Tendon Reflexes: +2 throughout, no ankle clonus Plantar  responses: downgoing bilaterally Cerebellar: no incoordination on finger to nose Gait: narrow-based and steady, able to tandem walk adequately. Tremor: none  IMPRESSION: This is a 40 year old left-handed man with a history of history of hypertension, hyperlipidemia, atrial fibrillation, autism, presenting to establish care for his seizures. Seizures suggestive of focal to bilateral tonic-clonic epilepsy, he reports olfactory hallucinations and post-ictal left-sided weakness. Neurological exam shows subjective decreased sensation on the left side. MRI brain degraded by motion, no acute changes seen. EEG unremarkable. He continues to have clusters of seizures on Keppra 1000mg  BID and will increase dose to 1500mg  BID. Records from his previous neurologist will be requested for review, we may consider prolonged EEG for seizure classification if he continues to have seizures on higher dose Keppra. He does not drive. He will follow-up in 3 months and knows to call for any changes.   Thank you for allowing me to participate in the care of this patient. Please do not hesitate to call for any questions or concerns.   , M.D.  CC: Dr. 

## 2016-10-23 ENCOUNTER — Other Ambulatory Visit: Payer: Self-pay | Admitting: Neurology

## 2016-10-23 DIAGNOSIS — G40209 Localization-related (focal) (partial) symptomatic epilepsy and epileptic syndromes with complex partial seizures, not intractable, without status epilepticus: Secondary | ICD-10-CM

## 2016-10-24 NOTE — Telephone Encounter (Signed)
Refused RX request: RX was sent to pharmacy 10/19/16

## 2016-11-01 DIAGNOSIS — F840 Autistic disorder: Secondary | ICD-10-CM | POA: Insufficient documentation

## 2017-01-13 ENCOUNTER — Ambulatory Visit (INDEPENDENT_AMBULATORY_CARE_PROVIDER_SITE_OTHER): Payer: Medicare Other | Admitting: Neurology

## 2017-01-13 ENCOUNTER — Encounter: Payer: Self-pay | Admitting: Neurology

## 2017-01-13 VITALS — BP 124/82 | HR 61 | Ht 73.0 in | Wt 212.1 lb

## 2017-01-13 DIAGNOSIS — F840 Autistic disorder: Secondary | ICD-10-CM

## 2017-01-13 DIAGNOSIS — G40209 Localization-related (focal) (partial) symptomatic epilepsy and epileptic syndromes with complex partial seizures, not intractable, without status epilepticus: Secondary | ICD-10-CM

## 2017-01-13 MED ORDER — LEVETIRACETAM 1000 MG PO TABS: ORAL_TABLET | ORAL | 3 refills | Status: DC

## 2017-01-13 NOTE — Patient Instructions (Signed)
1. Continue Keppra 1000mg : Take 1.5 tablets twice a day 2. Keep a calendar of your seizures 3. Follow-up in 3 months, call for any changes  Seizure Precautions: 1. If medication has been prescribed for you to prevent seizures, take it exactly as directed.  Do not stop taking the medicine without talking to your doctor first, even if you have not had a seizure in a long time.   2. Avoid activities in which a seizure would cause danger to yourself or to others.  Don't operate dangerous machinery, swim alone, or climb in high or dangerous places, such as on ladders, roofs, or girders.  Do not drive unless your doctor says you may.  3. If you have any warning that you may have a seizure, lay down in a safe place where you can't hurt yourself.    4.  No driving for 6 months from last seizure, as per [facility].   Please refer to the following link on the [employer]'s website for more information: http://www.epilepsyfoundation.org/answerplace/Social/driving/drivingu.cfm   5.  Maintain good sleep hygiene. Avoid alcohol.  6.  Contact your doctor if you have any problems that may be related to the medicine you are taking.  7.  Call 911 and bring the patient back to the ED if:        A.  The seizure lasts longer than 5 minutes.       B.  The patient doesn't awaken shortly after the seizure  C.  The patient has new problems such as difficulty seeing, speaking or moving  D.  The patient was injured during the seizure  E.  The patient has a temperature over 102 F (39C)  F.  The patient vomited and now is having trouble breathing

## 2017-01-13 NOTE — Progress Notes (Addendum)
NEUROLOGY FOLLOW UP OFFICE NOTE  Adrian Carter [id number]  HISTORY OF PRESENT ILLNESS: I had the pleasure of seeing Adrian Carter in follow-up in the neurology clinic on 01/13/2017.  The patient was last seen 3 months ago for recurrent seizures and is again accompanied by his caregiver who helps supplement the history today. Since his last visit, he reports having one seizure on Christmas day. He recalls feeling dizzy, then waking up on the couch. His brother was there and told him he stared off, no shaking reported. No tongue bite or incontinence/no injuries. He is tolerating Keppra 1500mg  BID, reporting he feels nauseated. He reports stress at home, his brother's children stress him out. His caregiver stays with him 3 hours daily and has not witnessed any seizures. He is reporting left knee pain and had some hand numbness after a recent EMG with his Orthopedist. He reports one fall down the stairs, no loss of consciousness. He was also found to have hearing loss and will be seeing ENT.   HPI 10/19/16: This is a 41 yo LH man with a history of hypertension, hyperlipidemia, atrial fibrillation, autism, presenting to establish care for his seizures. He is a poor historian and is unable to say when exactly seizures started. From records reviewed, when he first saw his PCP in March 2017, his mother reported 3 seizures in his lifetime. He was taking lorazepam 1mg  BID at that time. He was admitted in April 2017 to [facility] after he had several seizures in one day. He is amnestic of events, but denied any fevers or head trauma at that time. He had been seeing neurologist Dr. May 2017 until he closed his practice. On hospital discharge, he was given lorazepam 1mg  BID which he appears to have been taking regularly, and Keppra 500mg  BID. They report that the Ativan has been switched to Valium for agitation. He reports taking Keppra 1000mg  BID for the past 4-5 months (caregiver states she checked bottle before visit).  They continue to report recurrent seizures, he had a cluster of 4 seizures last weekend. He reports vomiting after the seizures. He reports his left side feels weaker after, and would occasionally notices a burning smell. His caregiver has not witnessed any staring or unresponsive episodes. He denies any myoclonic jerks.   He has occasional throbbing headaches in the occipital region, at times with associated nausea. He reports dizziness with a spinning sensation occurring every other day when he gets out of bed, lasting about an hour. He reports horizontal diplopia. He has low back pain. No bowel/bladder dysfunction. He reports falling down steps on to his left arm, with continued throbbing pain in the left arm.   Epilepsy Risk Factors:  His paternal uncle had seizures. Patient has autism. As far as he knows, there is no history of febrile convulsions, CNS infections such as meningitis/encephalitis, significant traumatic brain injury, neurosurgical procedures.  Diagnostic Data: I personally reviewed MRI brain without contrast done 03/28/16 which was degraded by motion, no acute changes seen.  EEG done 03/28/16 was normal. There was excess diffuse beta activity seen  PAST MEDICAL HISTORY: Past Medical History:  Diagnosis Date  . Autism   . PAF (paroxysmal atrial fibrillation) (HCC)   . Seizure [facility])     MEDICATIONS: Current Outpatient Prescriptions on File Prior to Visit  Medication Sig Dispense Refill  . amLODipine (NORVASC) 10 MG tablet Take 1 tablet (10 mg total) by mouth daily. 30 tablet 0  . aspirin EC 325 MG tablet Take 325  mg by mouth 2 (two) times daily.    . diazepam (VALIUM) 10 MG tablet Take one tablet twice per day as needed for anxiety    . flecainide (TAMBOCOR) 50 MG tablet Take 2 tablets (100 mg total) by mouth 2 (two) times daily. 60 tablet 0  . levETIRAcetam (KEPPRA) 1000 MG tablet Take 1 & 1/2 tablets twice a day 90 tablet 6  . metoprolol tartrate (LOPRESSOR) 50 MG tablet  Take 1 tablet (50 mg total) by mouth 2 (two) times daily. 60 tablet 0  . montelukast (SINGULAIR) 10 MG tablet Take 10 mg by mouth at bedtime.    Marland Kitchen oxyCODONE (ROXICODONE) 15 MG immediate release tablet Take 15 mg by mouth every 8 (eight) hours as needed for pain.    . pravastatin (PRAVACHOL) 20 MG tablet Take 20 mg by mouth at bedtime.     No current facility-administered medications on file prior to visit.     ALLERGIES: Allergies  Allergen Reactions  . Morphine And Related Anaphylaxis  . Penicillins Anaphylaxis    Has patient had a PCN reaction causing immediate rash, facial/tongue/throat swelling, SOB or lightheadedness with hypotension: No Has patient had a PCN reaction causing severe rash involving mucus membranes or skin necrosis: No Has patient had a PCN reaction that required hospitalization No Has patient had a PCN reaction occurring within the last 10 years: No If all of the above answers are "NO", then may proceed with Cephalosporin use.    FAMILY HISTORY: Family History  Problem Relation Age of Onset  . Hypertension Mother     SOCIAL HISTORY: Social History   Social History  . Marital status: Single    Spouse name: N/A  . Number of children: N/A  . Years of education: N/A   Occupational History  . unemployed    Social History Main Topics  . Smoking status: Never Smoker  . Smokeless tobacco: Never Used  . Alcohol use No  . Drug use: No  . Sexual activity: No   Other Topics Concern  . Not on file   Social History Narrative  . No narrative on file    REVIEW OF SYSTEMS: Constitutional: No fevers, chills, or sweats, no generalized fatigue, change in appetite Eyes: No visual changes, double vision, eye pain Ear, nose and throat: + hearing loss, no ear pain, nasal congestion, sore throat Cardiovascular: No chest pain, palpitations Respiratory:  No shortness of breath at rest or with exertion, wheezes GastrointestinaI: No nausea, vomiting, diarrhea,  abdominal pain, fecal incontinence Genitourinary:  No dysuria, urinary retention or frequency Musculoskeletal:  No neck pain, back pain Integumentary: No rash, pruritus, skin lesions Neurological: as above Psychiatric: No depression, insomnia, anxiety Endocrine: No palpitations, fatigue, diaphoresis, mood swings, change in appetite, change in weight, increased thirst Hematologic/Lymphatic:  No anemia, purpura, petechiae. Allergic/Immunologic: no itchy/runny eyes, nasal congestion, recent allergic reactions, rashes  PHYSICAL EXAM: Vitals:   01/13/17 1044  BP: 124/82  Pulse: 61   General: No acute distress Head:  Normocephalic/atraumatic Neck: supple, no paraspinal tenderness, full range of motion Heart:  Regular rate and rhythm Lungs:  Clear to auscultation bilaterally Back: No paraspinal tenderness Skin/Extremities: No rash, no edema Neurological Exam: alert and oriented to person, place, and time. No aphasia or dysarthria. Fund of knowledge is appropriate.  Recent and remote memory are intact.  Attention and concentration are normal.    Able to name objects and repeat phrases. Cranial nerves: Pupils equal, round, reactive to light.  Extraocular movements intact with  no nystagmus. Visual fields full. Facial sensation intact. No facial asymmetry. Tongue, uvula, palate midline.  Motor: Bulk and tone normal, muscle strength 5/5 throughout with no pronator drift.  Sensation to light touch intact.  No extinction to double simultaneous stimulation.  Deep tendon reflexes 2+ throughout, toes downgoing.  Finger to nose testing intact.  Gait narrow-based and steady, able to tandem walk adequately.  Romberg negative.  IMPRESSION: This is a 41 yo LH man with a history of history of hypertension, hyperlipidemia, atrial fibrillation, autism, and seizures suggestive of focal to bilateral tonic-clonic epilepsy, he reports olfactory hallucinations and post-ictal left-sided weakness. Keppra dose increased  last November to 1500mg  BID, with only one seizure last Christmas. Continue on current dose for now. He will keep a calendar of his seizures. Consider prolonged EEG for seizure classification if he continues to have seizures on higher dose Keppra. He does not drive. He will follow-up in 3 months and knows to call for any changes.   Thank you for allowing me to participate in his care.  Please do not hesitate to call for any questions or concerns.  The duration of this appointment visit was 15 minutes of face-to-face time with the patient.  Greater than 50% of this time was spent in counseling, explanation of diagnosis, planning of further management, and coordination of care.   Patrcia Dolly, M.D.   CC: Dr. Thomasena Edis  ADDENDUM 01/20/17: Records from prior neurologist reviewed: EEG done 08/30/16 was normal.

## 2017-01-27 ENCOUNTER — Encounter (HOSPITAL_COMMUNITY): Payer: Self-pay | Admitting: Family Medicine

## 2017-01-27 ENCOUNTER — Inpatient Hospital Stay (HOSPITAL_COMMUNITY)
Admission: AD | Admit: 2017-01-27 | Discharge: 2017-01-29 | DRG: 101 | Disposition: A | Discharge status: Home Health | Payer: Medicare Other | Source: Other Acute Inpatient Hospital | Attending: Internal Medicine | Admitting: Internal Medicine

## 2017-01-27 ENCOUNTER — Telehealth: Payer: Self-pay | Admitting: Neurology

## 2017-01-27 DIAGNOSIS — I10 Essential (primary) hypertension: Secondary | ICD-10-CM | POA: Diagnosis not present

## 2017-01-27 DIAGNOSIS — I480 Paroxysmal atrial fibrillation: Secondary | ICD-10-CM | POA: Diagnosis not present

## 2017-01-27 DIAGNOSIS — F840 Autistic disorder: Secondary | ICD-10-CM | POA: Diagnosis present

## 2017-01-27 DIAGNOSIS — G40919 Epilepsy, unspecified, intractable, without status epilepticus: Secondary | ICD-10-CM

## 2017-01-27 DIAGNOSIS — Z79899 Other long term (current) drug therapy: Secondary | ICD-10-CM

## 2017-01-27 DIAGNOSIS — R569 Unspecified convulsions: Secondary | ICD-10-CM

## 2017-01-27 DIAGNOSIS — G40909 Epilepsy, unspecified, not intractable, without status epilepticus: Principal | ICD-10-CM | POA: Diagnosis present

## 2017-01-27 DIAGNOSIS — G8929 Other chronic pain: Secondary | ICD-10-CM | POA: Diagnosis not present

## 2017-01-27 DIAGNOSIS — D509 Iron deficiency anemia, unspecified: Secondary | ICD-10-CM | POA: Diagnosis present

## 2017-01-27 DIAGNOSIS — F419 Anxiety disorder, unspecified: Secondary | ICD-10-CM | POA: Diagnosis not present

## 2017-01-27 DIAGNOSIS — Z8249 Family history of ischemic heart disease and other diseases of the circulatory system: Secondary | ICD-10-CM

## 2017-01-27 DIAGNOSIS — Z7982 Long term (current) use of aspirin: Secondary | ICD-10-CM

## 2017-01-27 DIAGNOSIS — E785 Hyperlipidemia, unspecified: Secondary | ICD-10-CM | POA: Diagnosis not present

## 2017-01-27 DIAGNOSIS — Z880 Allergy status to penicillin: Secondary | ICD-10-CM | POA: Diagnosis not present

## 2017-01-27 HISTORY — DX: Epilepsy, unspecified, intractable, without status epilepticus: G40.919

## 2017-01-27 MED ORDER — OXYCODONE HCL 5 MG PO TABS
15.0000 mg | ORAL_TABLET | Freq: Three times a day (TID) | ORAL | Status: DC | PRN
  Administered 2017-01-27 – 2017-01-29 (×5): 15 mg via ORAL
  Filled 2017-01-27 (×5): qty 3

## 2017-01-27 MED ORDER — ENOXAPARIN SODIUM 40 MG/0.4ML ~~LOC~~ SOLN
40.0000 mg | SUBCUTANEOUS | Status: DC
  Administered 2017-01-28 – 2017-01-29 (×2): 40 mg via SUBCUTANEOUS
  Filled 2017-01-27 (×2): qty 0.4

## 2017-01-27 MED ORDER — MONTELUKAST SODIUM 10 MG PO TABS
10.0000 mg | ORAL_TABLET | Freq: Every day | ORAL | Status: DC
  Administered 2017-01-27 – 2017-01-28 (×2): 10 mg via ORAL
  Filled 2017-01-27 (×2): qty 1

## 2017-01-27 MED ORDER — SODIUM CHLORIDE 0.9% FLUSH
3.0000 mL | Freq: Two times a day (BID) | INTRAVENOUS | Status: DC
  Administered 2017-01-28 (×2): 3 mL via INTRAVENOUS

## 2017-01-27 MED ORDER — ONDANSETRON HCL 4 MG PO TABS: 4.0000 mg | ORAL_TABLET | Freq: Four times a day (QID) | ORAL | Status: DC | PRN

## 2017-01-27 MED ORDER — SODIUM CHLORIDE 0.9 % IV SOLN: 250.0000 mL | INTRAVENOUS | Status: DC | PRN

## 2017-01-27 MED ORDER — POLYETHYLENE GLYCOL 3350 17 G PO PACK: 17.0000 g | PACK | Freq: Every day | ORAL | Status: DC | PRN

## 2017-01-27 MED ORDER — DIAZEPAM 5 MG PO TABS
10.0000 mg | ORAL_TABLET | Freq: Two times a day (BID) | ORAL | Status: DC | PRN
Start: 2017-01-27 — End: 2017-01-29
  Administered 2017-01-28: 10 mg via ORAL
  Filled 2017-01-27: qty 2

## 2017-01-27 MED ORDER — PRAVASTATIN SODIUM 20 MG PO TABS
20.0000 mg | ORAL_TABLET | Freq: Every day | ORAL | Status: DC
  Administered 2017-01-28: 20 mg via ORAL
  Filled 2017-01-27: qty 1

## 2017-01-27 MED ORDER — ACETAMINOPHEN 325 MG PO TABS
650.0000 mg | ORAL_TABLET | Freq: Four times a day (QID) | ORAL | Status: DC | PRN
  Administered 2017-01-28: 650 mg via ORAL
  Filled 2017-01-27: qty 2

## 2017-01-27 MED ORDER — AMLODIPINE BESYLATE 10 MG PO TABS
10.0000 mg | ORAL_TABLET | Freq: Every day | ORAL | Status: DC
  Administered 2017-01-28 – 2017-01-29 (×2): 10 mg via ORAL
  Filled 2017-01-27 (×2): qty 1

## 2017-01-27 MED ORDER — FLECAINIDE ACETATE 50 MG PO TABS
50.0000 mg | ORAL_TABLET | Freq: Two times a day (BID) | ORAL | Status: DC
Start: 2017-01-27 — End: 2017-01-29
  Administered 2017-01-27 – 2017-01-29 (×4): 50 mg via ORAL
  Filled 2017-01-27 (×5): qty 1

## 2017-01-27 MED ORDER — SODIUM CHLORIDE 0.9% FLUSH: 3.0000 mL | INTRAVENOUS | Status: DC | PRN

## 2017-01-27 MED ORDER — METOPROLOL TARTRATE 50 MG PO TABS: 50.0000 mg | ORAL_TABLET | Freq: Two times a day (BID) | ORAL | Status: DC

## 2017-01-27 MED ORDER — ASPIRIN EC 325 MG PO TBEC
325.0000 mg | DELAYED_RELEASE_TABLET | Freq: Every day | ORAL | Status: DC
  Administered 2017-01-28 – 2017-01-29 (×2): 325 mg via ORAL
  Filled 2017-01-27 (×2): qty 1

## 2017-01-27 MED ORDER — SODIUM CHLORIDE 0.9% FLUSH
3.0000 mL | Freq: Two times a day (BID) | INTRAVENOUS | Status: DC
  Administered 2017-01-27 – 2017-01-29 (×3): 3 mL via INTRAVENOUS

## 2017-01-27 MED ORDER — ONDANSETRON HCL 4 MG/2ML IJ SOLN
4.0000 mg | Freq: Four times a day (QID) | INTRAMUSCULAR | Status: DC | PRN
  Administered 2017-01-28 (×2): 4 mg via INTRAVENOUS
  Filled 2017-01-27 (×2): qty 2

## 2017-01-27 MED ORDER — LEVETIRACETAM 750 MG PO TABS
1500.0000 mg | ORAL_TABLET | Freq: Two times a day (BID) | ORAL | Status: DC
  Administered 2017-01-27 – 2017-01-29 (×4): 1500 mg via ORAL
  Filled 2017-01-27 (×4): qty 2

## 2017-01-27 MED ORDER — ACETAMINOPHEN 650 MG RE SUPP: 650.0000 mg | Freq: Four times a day (QID) | RECTAL | Status: DC | PRN

## 2017-01-27 NOTE — Telephone Encounter (Signed)
Spoke to ER at [facility], per patient he had 3 seizures today. Says he takes Keppra 1500mg  BID. Instructed to add on Topamax 25mg  BID.

## 2017-01-27 NOTE — H&P (Signed)
History and Physical    Adrian Carter LKG:[id number] DOB: 1976/10/16 DOA: 01/27/2017  PCP: Irena Reichmann, DO   Patient coming from: Home, by way of [facility]   Chief Complaint: Breakthrough seizures  HPI: Adrian Carter is a 41 y.o. male with medical history significant for autism, seizure disorder, and paroxysmal atrial fibrillation who presents in transfer from [facility] where he was seen for breakthrough seizures. Patient has a long-standing seizure history for which he follows with outpatient neurologist and is managed with Keppra 1500 mg twice daily, but reports having seizures approximately once a week. Today, he had 3 seizures at home, reportedly returning to baseline in between each of these, and was taken by his parents to [facility] ED for evaluation. Patient reports that he typically has seizures during stressful periods, reports being under a lot of stress at the moment due to a family problem that he does not wish to discuss further. He reports continued adherence with his medications. He denies any recent fall or head trauma. He denies any headache, change in vision or hearing, or focal numbness or weakness. He denies any recent use of alcohol or illicit substances.  [place] ED Course: Upon arrival to the [facility], patient is found to be afebrile, saturating well on room air, and with vital signs otherwise stable. Noncontrast head CT was a stable and normal study. Chest x-ray was negative for acute cardiopulmonary disease. Chemistry panel was unremarkable. CBC is notable for a stable microcytic anemia, hemoglobin 13.6. Influenza is negative, INR is normal, troponin was undetectable, BNP is within the normal limits, urinalysis unremarkable, and UDS positive only for prescribed benzodiazepines and oxycodone. ED physician discussed the case with the patient's outpatient neurologist who advised adding Topamax. Patient was given 25 mg oral Topamax and was being  prepared for discharge home. While still in the ED, the patient had another generalized seizure. Neurologist to [facility] was consulted by the ED physician following this witnessed seizure and it was advised that the patient be loaded with 1500 mg IV fosphenytoin and transferred to [facility] for a medical admission.   Patient is interviewed and examined on the telemetry unit at [facility] where he remained hemodynamically stable, not in any respiratory distress, and with grossly normal neurologic exam.  Review of Systems:  All other systems reviewed and apart from HPI, are negative.  Past Medical History:  Diagnosis Date  . Autism   . PAF (paroxysmal atrial fibrillation) (HCC)   . Seizure [facility])     Past Surgical History:  Procedure Laterality Date  . FOOT SURGERY    . TONSILLECTOMY AND ADENOIDECTOMY       reports that he has never smoked. He has never used smokeless tobacco. He reports that he does not drink alcohol or use drugs.  Allergies  Allergen Reactions  . Morphine And Related Anaphylaxis  . Penicillins Anaphylaxis    Has patient had a PCN reaction causing immediate rash, facial/tongue/throat swelling, SOB or lightheadedness with hypotension: No Has patient had a PCN reaction causing severe rash involving mucus membranes or skin necrosis: No Has patient had a PCN reaction that required hospitalization No Has patient had a PCN reaction occurring within the last 10 years: No If all of the above answers are "NO", then may proceed with Cephalosporin use.    Family History  Problem Relation Age of Onset  . Hypertension Mother      Prior to Admission medications   Medication Sig Start  Date End Date Taking? Authorizing Provider  amLODipine (NORVASC) 10 MG tablet Take 1 tablet (10 mg total) by mouth daily. 04/02/16   Leroy Sea, MD  aspirin EC 325 MG tablet Take 325 mg by mouth 2 (two) times daily.    Historical Provider, MD  diazepam  (VALIUM) 10 MG tablet Take one tablet twice per day as needed for anxiety 09/28/16   Historical Provider, MD  flecainide (TAMBOCOR) 50 MG tablet Take 2 tablets (100 mg total) by mouth 2 (two) times daily. 04/01/16   Leroy Sea, MD  levETIRAcetam (KEPPRA) 1000 MG tablet Take 1 & 1/2 tablets twice a day 01/13/17   Van Clines, MD  metoprolol tartrate (LOPRESSOR) 50 MG tablet Take 1 tablet (50 mg total) by mouth 2 (two) times daily. 04/01/16   Leroy Sea, MD  montelukast (SINGULAIR) 10 MG tablet Take 10 mg by mouth at bedtime.    Historical Provider, MD  oxyCODONE (ROXICODONE) 15 MG immediate release tablet Take 15 mg by mouth every 8 (eight) hours as needed for pain.    Historical Provider, MD  pravastatin (PRAVACHOL) 20 MG tablet Take 20 mg by mouth at bedtime.    Historical Provider, MD    Physical Exam: Vitals:   01/27/17 2143  BP: 136/81  Pulse: 69  Resp: 18  Temp: 98.1 F (36.7 C)  TempSrc: Oral  SpO2: 97%  Weight: 94.8 kg (209 lb)  Height: 6\' 1"  (1.854 m)      Constitutional: NAD, calm, comfortable Eyes: PERTLA, lids and conjunctivae normal ENMT: Mucous membranes are moist. Posterior pharynx clear of any exudate or lesions.   Neck: normal, supple, no masses, no thyromegaly Respiratory: clear to auscultation bilaterally, no wheezing, no crackles. Normal respiratory effort.   Cardiovascular: S1 & S2 heard, regular rate and rhythm. No extremity edema. No significant JVD. Abdomen: No distension, no tenderness, no masses palpated. Bowel sounds normal.  Musculoskeletal: no clubbing / cyanosis. No joint deformity upper and lower extremities. Normal muscle tone.  Skin: no significant rashes, lesions, ulcers. Warm, dry, well-perfused. Neurologic: CN 2-12 grossly intact. Sensation intact, DTR normal. Strength 5/5 in all 4 limbs.  Psychiatric: Normal judgment and insight. Alert and oriented x 3. Normal mood and affect.     Labs on Admission: I have personally reviewed  following labs and imaging studies  CBC: No results for input(s): WBC, NEUTROABS, HGB, HCT, MCV, PLT in the last 168 hours. Basic Metabolic Panel: No results for input(s): NA, K, CL, CO2, GLUCOSE, BUN, CREATININE, CALCIUM, MG, PHOS in the last 168 hours. GFR: CrCl cannot be calculated (Patient's most recent lab result is older than the maximum 21 days allowed.). Liver Function Tests: No results for input(s): AST, ALT, ALKPHOS, BILITOT, PROT, ALBUMIN in the last 168 hours. No results for input(s): LIPASE, AMYLASE in the last 168 hours. No results for input(s): AMMONIA in the last 168 hours. Coagulation Profile: No results for input(s): INR, PROTIME in the last 168 hours. Cardiac Enzymes: No results for input(s): CKTOTAL, CKMB, CKMBINDEX, TROPONINI in the last 168 hours. BNP (last 3 results) No results for input(s): PROBNP in the last 8760 hours. HbA1C: No results for input(s): HGBA1C in the last 72 hours. CBG: No results for input(s): GLUCAP in the last 168 hours. Lipid Profile: No results for input(s): CHOL, HDL, LDLCALC, TRIG, CHOLHDL, LDLDIRECT in the last 72 hours. Thyroid Function Tests: No results for input(s): TSH, T4TOTAL, FREET4, T3FREE, THYROIDAB in the last 72 hours. Anemia Panel: No results  for input(s): VITAMINB12, FOLATE, FERRITIN, TIBC, IRON, RETICCTPCT in the last 72 hours. Urine analysis:    Component Value Date/Time   COLORURINE YELLOW 03/29/2016 1425   APPEARANCEUR CLEAR 03/29/2016 1425   LABSPEC 1.019 03/29/2016 1425   PHURINE 6.0 03/29/2016 1425   GLUCOSEU NEGATIVE 03/29/2016 1425   HGBUR LARGE (A) 03/29/2016 1425   BILIRUBINUR SMALL (A) 03/29/2016 1425   KETONESUR 40 (A) 03/29/2016 1425   PROTEINUR NEGATIVE 03/29/2016 1425   NITRITE NEGATIVE 03/29/2016 1425   LEUKOCYTESUR NEGATIVE 03/29/2016 1425   Sepsis Labs: @LABRCNTIP (procalcitonin:4,lacticidven:4) )No results found for this or any previous visit (from the past 240 hour(s)).   Radiological Exams  on Admission: No results found.  EKG: Not performed, will obtain as appropriate. Sinus rhythm on cardiac monitor.   Assessment/Plan  1. Breakthrough seizures - Patient with longstanding seizure hx, follows with neurology and managed with Keppra 1,500 mg BID  - He reports having about 1 seizure per week at baseline, denies recent fall or trauma, and denies alcohol or illicit drug use  - Head CT at the outside hospital was an normal, stable study; labs were largely unremarkable  - After telephone consultation with pt's outpt neurologist, he was given Topomax 25 mg, but then went on to have witnessed seizure in ED  - Unclear precipitant, no suggestion of infection, labs largely unremarkable, normal head CT, UDS positive for prescription drugs only  - Neurology consulting and much appreciated; patient was loaded with fosphenytoin 1,500 mg IV prior to transfer to [facility]  - Plan for EEG, brain MRI, seizure precautions; will follow-up neurology recommendations    2. Paroxysmal atrial fibrillation  - Follows with cardiology in outpatient setting on rhythm-control strategy  - CHADS-VASc at least 1 (for HTN)  - He is in a sinus rhythm on admission  - Continue flecainide and ASA 325    3. Hypertension  - BP at goal  - Continue Lopressor as tolerated    4. Chronic pain  - Stable on admission, no pain complaints  - Continue home-regimen of Oxy IR 15 mg q8h prn   5. Microcytic anemia  - Hgb is stable at 13.6 on admission; MCV 69 is lower than priors  - No sign of bleeding, anemia panel sent   6. Anxiety  - Stable  - Continue Valium 10 mg BID prn     DVT prophylaxis: sq Lovenox  Code Status: Full  Family Communication: Discussed with patient Disposition Plan: Observe on telemetry  Consults called: Neurology  Admission status: Observation     Briscoe Deutscher, MD [facility] Pager [phone]  If 7PM-7AM, please contact night-coverage [email or web address] Password [facility]  01/27/2017,  10:24 PM

## 2017-01-28 ENCOUNTER — Observation Stay (HOSPITAL_COMMUNITY): Payer: Medicare Other

## 2017-01-28 DIAGNOSIS — Z79899 Other long term (current) drug therapy: Secondary | ICD-10-CM | POA: Diagnosis not present

## 2017-01-28 DIAGNOSIS — G40909 Epilepsy, unspecified, not intractable, without status epilepticus: Secondary | ICD-10-CM | POA: Diagnosis present

## 2017-01-28 DIAGNOSIS — Z8249 Family history of ischemic heart disease and other diseases of the circulatory system: Secondary | ICD-10-CM | POA: Diagnosis not present

## 2017-01-28 DIAGNOSIS — I480 Paroxysmal atrial fibrillation: Secondary | ICD-10-CM | POA: Diagnosis present

## 2017-01-28 DIAGNOSIS — G40919 Epilepsy, unspecified, intractable, without status epilepticus: Secondary | ICD-10-CM

## 2017-01-28 DIAGNOSIS — E785 Hyperlipidemia, unspecified: Secondary | ICD-10-CM | POA: Diagnosis present

## 2017-01-28 DIAGNOSIS — G8929 Other chronic pain: Secondary | ICD-10-CM | POA: Diagnosis present

## 2017-01-28 DIAGNOSIS — R569 Unspecified convulsions: Secondary | ICD-10-CM | POA: Diagnosis not present

## 2017-01-28 DIAGNOSIS — F419 Anxiety disorder, unspecified: Secondary | ICD-10-CM | POA: Diagnosis present

## 2017-01-28 DIAGNOSIS — Z880 Allergy status to penicillin: Secondary | ICD-10-CM | POA: Diagnosis not present

## 2017-01-28 DIAGNOSIS — Z7982 Long term (current) use of aspirin: Secondary | ICD-10-CM | POA: Diagnosis not present

## 2017-01-28 DIAGNOSIS — F840 Autistic disorder: Secondary | ICD-10-CM | POA: Diagnosis present

## 2017-01-28 DIAGNOSIS — I10 Essential (primary) hypertension: Secondary | ICD-10-CM | POA: Diagnosis present

## 2017-01-28 DIAGNOSIS — D509 Iron deficiency anemia, unspecified: Secondary | ICD-10-CM | POA: Diagnosis present

## 2017-01-28 LAB — RETICULOCYTES
RBC.: 6.09 MIL/uL — AB (ref 4.22–5.81)
RETIC CT PCT: 1.4 % (ref 0.4–3.1)
Retic Count, Absolute: 85.3 10*3/uL (ref 19.0–186.0)

## 2017-01-28 LAB — COMPREHENSIVE METABOLIC PANEL
ALK PHOS: 103 U/L (ref 38–126)
ALT: 18 U/L (ref 17–63)
AST: 17 U/L (ref 15–41)
Albumin: 3.6 g/dL (ref 3.5–5.0)
Anion gap: 9 (ref 5–15)
BUN: 8 mg/dL (ref 6–20)
CALCIUM: 8.8 mg/dL — AB (ref 8.9–10.3)
CHLORIDE: 103 mmol/L (ref 101–111)
CO2: 25 mmol/L (ref 22–32)
CREATININE: 1.12 mg/dL (ref 0.61–1.24)
Glucose, Bld: 109 mg/dL — ABNORMAL HIGH (ref 65–99)
Potassium: 3.7 mmol/L (ref 3.5–5.1)
Sodium: 137 mmol/L (ref 135–145)
Total Bilirubin: 0.6 mg/dL (ref 0.3–1.2)
Total Protein: 6.7 g/dL (ref 6.5–8.1)

## 2017-01-28 LAB — CBC WITH DIFFERENTIAL/PLATELET
BASOS ABS: 0 10*3/uL (ref 0.0–0.1)
Basophils Relative: 0 %
EOS ABS: 0 10*3/uL (ref 0.0–0.7)
EOS PCT: 1 %
HCT: 43 % (ref 39.0–52.0)
Hemoglobin: 13.6 g/dL (ref 13.0–17.0)
LYMPHS ABS: 1.6 10*3/uL (ref 0.7–4.0)
LYMPHS PCT: 26 %
MCH: 22.3 pg — AB (ref 26.0–34.0)
MCHC: 31.6 g/dL (ref 30.0–36.0)
MCV: 70.6 fL — AB (ref 78.0–100.0)
MONO ABS: 0.6 10*3/uL (ref 0.1–1.0)
Monocytes Relative: 9 %
Neutro Abs: 4 10*3/uL (ref 1.7–7.7)
Neutrophils Relative %: 64 %
PLATELETS: 168 10*3/uL (ref 150–400)
RBC: 6.09 MIL/uL — ABNORMAL HIGH (ref 4.22–5.81)
RDW: 17.6 % — AB (ref 11.5–15.5)
WBC: 6.3 10*3/uL (ref 4.0–10.5)

## 2017-01-28 LAB — RAPID URINE DRUG SCREEN, HOSP PERFORMED
Amphetamines: NOT DETECTED
BARBITURATES: NOT DETECTED
Benzodiazepines: POSITIVE — AB
Cocaine: NOT DETECTED
OPIATES: POSITIVE — AB
TETRAHYDROCANNABINOL: NOT DETECTED

## 2017-01-28 LAB — FERRITIN: FERRITIN: 108 ng/mL (ref 24–336)

## 2017-01-28 LAB — IRON AND TIBC
Iron: 20 ug/dL — ABNORMAL LOW (ref 45–182)
Saturation Ratios: 7 % — ABNORMAL LOW (ref 17.9–39.5)
TIBC: 283 ug/dL (ref 250–450)
UIBC: 263 ug/dL

## 2017-01-28 LAB — MAGNESIUM: MAGNESIUM: 1.8 mg/dL (ref 1.7–2.4)

## 2017-01-28 LAB — PHENYTOIN LEVEL, TOTAL: Phenytoin Lvl: 15.6 ug/mL (ref 10.0–20.0)

## 2017-01-28 LAB — VITAMIN B12: VITAMIN B 12: 228 pg/mL (ref 180–914)

## 2017-01-28 LAB — FOLATE: Folate: 10.1 ng/mL (ref >5.9)

## 2017-01-28 LAB — HIV ANTIBODY (ROUTINE TESTING W REFLEX): HIV SCREEN 4TH GENERATION: NONREACTIVE

## 2017-01-28 LAB — ETHANOL

## 2017-01-28 MED ORDER — GADOBENATE DIMEGLUMINE 529 MG/ML IV SOLN
19.0000 mL | Freq: Once | INTRAVENOUS | Status: AC | PRN
  Administered 2017-01-28: 19 mL via INTRAVENOUS

## 2017-01-28 NOTE — Consult Note (Signed)
Admission H&P    Chief Complaint: Generalized seizures.  HPI: Adrian Carter is an 41 y.o. male with a history of seizure disorder, PAF, autism and hypertension, transferred from [facility] [place] for further management following breakthrough seizure activity. Seizures have been managed by Dr. Delice Lesch. He has been taking Keppra 1500 mg twice a day. Patient had 3 generalized seizures on 01/27/2017. Addition of Topamax was recommended, starting at 25 mg per day. Before he left the ED at [facility] he had another witnessed generalized seizure. He was given a loading dose of fosphenytoin 1500 mg PE. No further seizure activity has been reported. He had an MRI study of his brain on arrival here which was normal. He currently has no complaints other than feeling dizzy. Laboratory studies were unremarkable, including blood glucose and electrolytes. CBC showed findings consistent with a microcytic anemia.  Past Medical History:  Diagnosis Date  . Autism   . PAF (paroxysmal atrial fibrillation) ([place])   . Seizure [facility])     Past Surgical History:  Procedure Laterality Date  . FOOT SURGERY    . TONSILLECTOMY AND ADENOIDECTOMY      Family History  Problem Relation Age of Onset  . Hypertension Mother    Social History:  reports that he has never smoked. He has never used smokeless tobacco. He reports that he does not drink alcohol or use drugs.  Allergies:  Allergies  Allergen Reactions  . Morphine And Related Anaphylaxis  . Penicillins Anaphylaxis    Has patient had a PCN reaction causing immediate rash, facial/tongue/throat swelling, SOB or lightheadedness with hypotension: No Has patient had a PCN reaction causing severe rash involving mucus membranes or skin necrosis: No Has patient had a PCN reaction that required hospitalization No Has patient had a PCN reaction occurring within the last 10 years: No If all of the above answers are "NO", then may proceed with Cephalosporin use.     Medications Prior to Admission  Medication Sig Dispense Refill  . amLODipine (NORVASC) 10 MG tablet Take 1 tablet (10 mg total) by mouth daily. 30 tablet 0  . aspirin EC 325 MG tablet Take 325 mg by mouth 2 (two) times daily.    . diazepam (VALIUM) 10 MG tablet Take one tablet twice per day as needed for anxiety    . flecainide (TAMBOCOR) 50 MG tablet Take 2 tablets (100 mg total) by mouth 2 (two) times daily. 60 tablet 0  . levETIRAcetam (KEPPRA) 1000 MG tablet Take 1 & 1/2 tablets twice a day 270 tablet 3  . metoprolol tartrate (LOPRESSOR) 50 MG tablet Take 1 tablet (50 mg total) by mouth 2 (two) times daily. 60 tablet 0  . montelukast (SINGULAIR) 10 MG tablet Take 10 mg by mouth at bedtime.    Marland Kitchen oxyCODONE (ROXICODONE) 15 MG immediate release tablet Take 15 mg by mouth every 8 (eight) hours as needed for pain.    . pravastatin (PRAVACHOL) 20 MG tablet Take 20 mg by mouth at bedtime.      ROS: History obtained from chart review and the patient  General ROS: negative for - chills, fatigue, fever, night sweats, weight gain or weight loss Psychological ROS: negative for - behavioral disorder, hallucinations, memory difficulties, mood swings or suicidal ideation Ophthalmic ROS: negative for - blurry vision, double vision, eye pain or loss of vision ENT ROS: negative for - epistaxis, nasal discharge, oral lesions, sore throat, tinnitus or vertigo Allergy and Immunology ROS: negative for - hives or itchy/watery eyes Hematological  and Lymphatic ROS: negative for - bleeding problems, bruising or swollen lymph nodes Endocrine ROS: negative for - galactorrhea, hair pattern changes, polydipsia/polyuria or temperature intolerance Respiratory ROS: negative for - cough, hemoptysis, shortness of breath or wheezing Cardiovascular ROS: negative for - chest pain, dyspnea on exertion, edema or irregular heartbeat Gastrointestinal ROS: negative for - abdominal pain, diarrhea, hematemesis, nausea/vomiting  or stool incontinence Genito-Urinary ROS: negative for - dysuria, hematuria, incontinence or urinary frequency/urgency Musculoskeletal ROS: negative for - joint swelling or muscular weakness Neurological ROS: as noted in HPI Dermatological ROS: negative for rash and skin lesion changes  Physical Examination: Blood pressure 136/81, pulse 69, temperature 98.1 F (36.7 C), temperature source Oral, resp. rate 18, height _0  (1.854 m), weight 94.8 kg (209 lb), SpO2 97 %.  HEENT-  Normocephalic, no lesions, without obvious abnormality.  Normal external eye and conjunctiva.  Normal TM's bilaterally.  Normal auditory canals and external ears. Normal external nose, mucus membranes and septum.  Normal pharynx. Neck supple with no masses, nodes, nodules or enlargement. Cardiovascular - regular rate and rhythm, S1, S2 normal, no murmur, click, rub or gallop Lungs - chest clear, no wheezing, rales, normal symmetric air entry Abdomen - soft, non-tender; bowel sounds normal; no masses,  no organomegaly Extremities - no joint deformities, effusion, or inflammation and no edema  Neurologic Examination: Mental Status: Alert, oriented, no acute distress.  Speech fluent without evidence of aphasia. Able to follow commands without difficulty. Cranial Nerves: II-Visual fields were normal. III/IV/VI-Pupils were equal and reacted normally to light. Extraocular movements were full and conjugate.    V/VII-no facial numbness and no facial weakness. VIII-normal. X-normal speech and symmetrical palatal movement. XII-midline tongue extension with normal strength. Motor: 5/5 bilaterally with normal tone and bulk Sensory: Normal throughout. Deep Tendon Reflexes: 2+ and symmetric. Plantars: Flexor bilaterally Cerebellar: Normal finger-to-nose testing.  Results for orders placed or performed during the hospital encounter of 01/27/17 (from the past 48 hour(s))  Comprehensive metabolic panel     Status: Abnormal    Collection Time: 01/27/17 11:25 PM  Result Value Ref Range   Sodium 137 135 - 145 mmol/L   Potassium 3.7 3.5 - 5.1 mmol/L   Chloride 103 101 - 111 mmol/L   CO2 25 22 - 32 mmol/L   Glucose, Bld 109 (H) 65 - 99 mg/dL   BUN 8 6 - 20 mg/dL   Creatinine, Ser 1.12 0.61 - 1.24 mg/dL   Calcium 8.8 (L) 8.9 - 10.3 mg/dL   Total Protein 6.7 6.5 - 8.1 g/dL   Albumin 3.6 3.5 - 5.0 g/dL   AST 17 15 - 41 U/L   ALT 18 17 - 63 U/L   Alkaline Phosphatase 103 38 - 126 U/L   Total Bilirubin 0.6 0.3 - 1.2 mg/dL   GFR calc non Af Amer >60 >60 mL/min   GFR calc Af Amer >60 >60 mL/min    Comment: (NOTE) The eGFR has been calculated using the CKD EPI equation. This calculation has not been validated in all clinical situations. eGFR's persistently <60 mL/min signify possible Chronic Kidney Disease.    Anion gap 9 5 - 15  Magnesium     Status: None   Collection Time: 01/27/17 11:25 PM  Result Value Ref Range   Magnesium 1.8 1.7 - 2.4 mg/dL  CBC WITH DIFFERENTIAL     Status: Abnormal   Collection Time: 01/27/17 11:25 PM  Result Value Ref Range   WBC 6.3 4.0 - 10.5 K/uL  RBC 6.09 (H) 4.22 - 5.81 MIL/uL   Hemoglobin 13.6 13.0 - 17.0 g/dL   HCT 43.0 39.0 - 52.0 %   MCV 70.6 (L) 78.0 - 100.0 fL   MCH 22.3 (L) 26.0 - 34.0 pg   MCHC 31.6 30.0 - 36.0 g/dL   RDW 17.6 (H) 11.5 - 15.5 %   Platelets 168 150 - 400 K/uL    Comment: REPEATED TO VERIFY PLATELET COUNT CONFIRMED BY SMEAR    Neutrophils Relative % 64 %   Neutro Abs 4.0 1.7 - 7.7 K/uL   Lymphocytes Relative 26 %   Lymphs Abs 1.6 0.7 - 4.0 K/uL   Monocytes Relative 9 %   Monocytes Absolute 0.6 0.1 - 1.0 K/uL   Eosinophils Relative 1 %   Eosinophils Absolute 0.0 0.0 - 0.7 K/uL   Basophils Relative 0 %   Basophils Absolute 0.0 0.0 - 0.1 K/uL  Reticulocytes     Status: Abnormal   Collection Time: 01/27/17 11:53 PM  Result Value Ref Range   Retic Ct Pct 1.4 0.4 - 3.1 %   RBC. 6.09 (H) 4.22 - 5.81 MIL/uL   Retic Count, Manual 85.3 19.0 -  186.0 K/uL   Mr Jeri Cos Wo Contrast  Result Date: 01/28/2017 CLINICAL DATA:  Initial evaluation for breakthrough seizure. EXAM: MRI HEAD WITHOUT AND WITH CONTRAST TECHNIQUE: Multiplanar, multiecho pulse sequences of the brain and surrounding structures were obtained without and with intravenous contrast. CONTRAST:  52m MULTIHANCE GADOBENATE DIMEGLUMINE 529 MG/ML IV SOLN COMPARISON:  Prior CT from 01/27/2017. FINDINGS: Brain: Study somewhat degraded by motion artifact. Cerebral volume within normal limits for age. No focal parenchymal signal abnormality identified. No abnormal foci of restricted diffusion to suggest acute or subacute ischemia. Gray-white matter differentiation maintained. No foci susceptibility artifact to suggest acute or chronic intracranial hemorrhage. No evidence for chronic infarction. No mass lesion, midline shift, or mass effect. No hydrocephalus. No extra-axial fluid collection. Major dural sinuses are grossly patent. No abnormal enhancement. No intrinsic temporal lobe abnormality. Pituitary gland and suprasellar region within normal limits. Midline structures intact and normal. Vascular: Major intracranial vascular flow voids are well preserved and are normal. Right vertebral artery is poorly visualized, likely diminutive/ hypoplastic. Skull and upper cervical spine: Craniocervical junction within normal limits. Visualized upper cervical spine unremarkable. Bone marrow signal intensity within normal limits. No scalp soft tissue abnormality. Sinuses/Orbits: Globes and orbital soft tissues within normal limits. Paranasal sinuses are clear. No mastoid effusion. Inner ear structures normal. Other: No other significant finding. IMPRESSION: Normal brain MRI.  No acute intracranial process identified. Electronically Signed   By: BJeannine BogaM.D.   On: 01/28/2017 01:32    Assessment/Plan 41year old man with a seizure disorder and autism presenting with recurrent breakthrough  generalized seizures. Etiology is unclear. Clinical examination showed no abnormalities, and MRI of his brain was normal.  Recommendations: 1. Dilantin level 2. Continue Keppra at 1500 mg twice a day 3. Further treatment with Dilantin will be deferred until level is available  We will continue to follow this patient with you.  C.R. SNicole Kindred MD [facility] [phone] 01/28/2017, 1:37 AM

## 2017-01-28 NOTE — Progress Notes (Addendum)
Patient ID: Adrian Carter, male   DOB: 04/30/1976, 41 y.o.   MRN: [id number]  PROGRESS NOTE    Adrian Carter  PXT:[id number] DOB: June 26, 1976 DOA: 01/27/2017  PCP: Irena Reichmann, DO   Brief Narrative:  41 year old male with history of autism, seizure disorder, paroxysmal a fib who presented from [facility] to [facility] for management of breakthrough seizures. He is managed with Keppra 1500 mg bID  But apparently still has seizures about once a week. Ont he day of the presentation he has had 3 seizure episodes. He reported compliance with his meds. He does say he has been under lot of stress lately.   Assessment & Plan:   Active Problems: Seizure disorder / Breakthrough seizure  - Seen by neuro in consultation - Recommendation to continue keppra at current dose 1500 mg twice a day - Dilantin level WNL - Monitor for seizures - UDS and alcohol level not checked, will obtain the studies today    Essential hypertension - Continue Norvasc 10 mg daily    Paroxysmal a-fib (HCC) - CHADS vasc score 1 - On flecainide - AC with aspirin daily     Dyslipidemia - Continue Pravachol 20 mg at bedtime    DVT prophylaxis: Lovenox subQ Code Status: full code  Family Communication: no family at the bedside this am Disposition Plan: home once cleared by neuro    Consultants:   Neurology   PT   Procedures:   None   Antimicrobials:   None    Subjective: No overnight events.   Objective: Vitals:   01/28/17 0141 01/28/17 0521 01/28/17 0958 01/28/17 1328  BP: (!) 146/93 122/66 130/76 (!) 146/90  Pulse: 85 85 82 83  Resp: 20 20 18 18   Temp: 97.8 F (36.6 C) 98.4 F (36.9 C) 97.7 F (36.5 C) 97.7 F (36.5 C)  TempSrc: Oral Oral Oral Oral  SpO2: 95% 96% 96% 97%  Weight:      Height:       No intake or output data in the 24 hours ending 01/28/17 1458 Filed Weights   01/27/17 2143  Weight: 94.8 kg (209 lb)    Examination:  General exam: Appears calm and comfortable    Respiratory system: Clear to auscultation. Respiratory effort normal. Cardiovascular system: S1 & S2 heard, RRR. No JVD, murmurs, rubs, gallops or clicks. No pedal edema. Gastrointestinal system: Abdomen is nondistended, soft and nontender. No organomegaly or masses felt. Normal bowel sounds heard. Central nervous system: no focal deficits  Extremities: Symmetric 5 x 5 power. Skin: No rashes, lesions or ulcers Psychiatry: Mood & affect appropriate.   Data Reviewed: I have personally reviewed following labs and imaging studies  CBC:  Recent Labs Lab 01/27/17 2325  WBC 6.3  NEUTROABS 4.0  HGB 13.6  HCT 43.0  MCV 70.6*  PLT 168   Basic Metabolic Panel:  Recent Labs Lab 01/27/17 2325  NA 137  K 3.7  CL 103  CO2 25  GLUCOSE 109*  BUN 8  CREATININE 1.12  CALCIUM 8.8*  MG 1.8   GFR: Estimated Creatinine Clearance: 101.1 mL/min (by C-G formula based on SCr of 1.12 mg/dL). Liver Function Tests:  Recent Labs Lab 01/27/17 2325  AST 17  ALT 18  ALKPHOS 103  BILITOT 0.6  PROT 6.7  ALBUMIN 3.6   No results for input(s): LIPASE, AMYLASE in the last 168 hours. No results for input(s): AMMONIA in the last 168 hours. Coagulation Profile: No results for input(s): INR, PROTIME in the  last 168 hours. Cardiac Enzymes: No results for input(s): CKTOTAL, CKMB, CKMBINDEX, TROPONINI in the last 168 hours. BNP (last 3 results) No results for input(s): PROBNP in the last 8760 hours. HbA1C: No results for input(s): HGBA1C in the last 72 hours. CBG: No results for input(s): GLUCAP in the last 168 hours. Lipid Profile: No results for input(s): CHOL, HDL, LDLCALC, TRIG, CHOLHDL, LDLDIRECT in the last 72 hours. Thyroid Function Tests: No results for input(s): TSH, T4TOTAL, FREET4, T3FREE, THYROIDAB in the last 72 hours. Anemia Panel:  Recent Labs  01/27/17 2353 01/27/17 2354  VITAMINB12 228  --   FOLATE  --  10.1  FERRITIN 108  --   TIBC 283  --   IRON 20*  --    RETICCTPCT 1.4  --    Urine analysis:    Component Value Date/Time   COLORURINE YELLOW 03/29/2016 1425   APPEARANCEUR CLEAR 03/29/2016 1425   LABSPEC 1.019 03/29/2016 1425   PHURINE 6.0 03/29/2016 1425   GLUCOSEU NEGATIVE 03/29/2016 1425   HGBUR LARGE (A) 03/29/2016 1425   BILIRUBINUR SMALL (A) 03/29/2016 1425   KETONESUR 40 (A) 03/29/2016 1425   PROTEINUR NEGATIVE 03/29/2016 1425   NITRITE NEGATIVE 03/29/2016 1425   LEUKOCYTESUR NEGATIVE 03/29/2016 1425   Sepsis Labs: @LABRCNTIP (procalcitonin:4,lacticidven:4)   )No results found for this or any previous visit (from the past 240 hour(s)).    Radiology Studies: Mr Laqueta Jean Contrast Result Date: 01/28/2017 Normal brain MRI.  No acute intracranial process identified.    Scheduled Meds: . amLODipine  10 mg Oral Daily  . aspirin EC  325 mg Oral Daily  . enoxaparin  injection  40 mg Subcutaneous Q24H  . flecainide  50 mg Oral BID  . levETIRAcetam  1,500 mg Oral BID  . montelukast  10 mg Oral QHS  . pravastatin  20 mg Oral QHS   Continuous Infusions:   LOS: 0 days    Time spent: 25 minutes  Greater than 50% of the time spent on counseling and coordinating the care.   01/30/2017, MD [facility] Pager [phone]  If 7PM-7AM, please contact night-coverage [email or web address] Password [facility] 01/28/2017, 2:58 PM

## 2017-01-28 NOTE — Evaluation (Signed)
Physical Therapy Evaluation Patient Details Name: Adrian Carter MRN: [id number] DOB: 03/20/1976 Today's Date: 01/28/2017   History of Present Illness  Patient is a 41 yo male admitted 01/27/17 with breakthrough seizures.    PMH:  seizure disorder, autism, PAF  Clinical Impression  Patient presents with problems listed below.  Will benefit from acute PT to maximize functional mobility prior to discharge home with mother.  Patient with decreased mobility and balance.  Recommend f/u HHPT for continued therapy.    Follow Up Recommendations Home health PT;Supervision for mobility/OOB    Equipment Recommendations  Rolling walker with 5" wheels    Recommendations for Other Services       Precautions / Restrictions Precautions Precautions: Fall Precaution Comments: Seizure disorder Restrictions Weight Bearing Restrictions: No      Mobility  Bed Mobility Overal bed mobility: Modified Independent             General bed mobility comments: Patient able to move to sitting with no assist.  Once upright, patient c/o feeling dizzy.  Patient sat EOB x 5 minutes.  Balance good in sitting.  Transfers Overall transfer level: Needs assistance Equipment used: None Transfers: Sit to/from Stand Sit to Stand: Min guard         General transfer comment: Assist for safety/balance during transfers.  Patient stood x 45 seconds and reports he felt dizzy and weak.  Returned to sitting > supine.  Declined further mobility.  Ambulation/Gait             General Gait Details: NT  Stairs            Wheelchair Mobility    Modified Rankin (Stroke Patients Only)       Balance Overall balance assessment: Needs assistance;History of Falls Sitting-balance support: No upper extremity supported;Feet supported Sitting balance-Leahy Scale: Good     Standing balance support: No upper extremity supported Standing balance-Leahy Scale: Fair                                Pertinent Vitals/Pain Pain Assessment: No/denies pain    Home Living Family/patient expects to be discharged to:: Private residence Living Arrangements: Parent (Mother) Available Help at Discharge: Family;Available 24 hours/day Type of Home: Mobile home Home Access: Stairs to enter Entrance Stairs-Rails: Doctor, general practice of Steps: 3 Home Layout: One level Home Equipment: None      Prior Function Level of Independence: Needs assistance   Gait / Transfers Assistance Needed: Patient ambulates independently  ADL's / Homemaking Assistance Needed: Mother assists with meal prep and housekeeping.  Assists with bathing "on a bad day".        Hand Dominance        Extremity/Trunk Assessment   Upper Extremity Assessment Upper Extremity Assessment: Overall WFL for tasks assessed    Lower Extremity Assessment Lower Extremity Assessment: Generalized weakness       Communication   Communication: No difficulties  Cognition Arousal/Alertness: Awake/alert Behavior During Therapy: WFL for tasks assessed/performed Overall Cognitive Status: No family/caregiver present to determine baseline cognitive functioning                 General Comments: Oriented x3.  Able to answer questions appropriately.    General Comments      Exercises     Assessment/Plan    PT Assessment Patient needs continued PT services  PT Problem List Decreased strength;Decreased activity tolerance;Decreased balance;Decreased mobility;Decreased knowledge of use of  DME       PT Treatment Interventions DME instruction;Gait training;Stair training;Functional mobility training;Therapeutic activities;Therapeutic exercise;Balance training;Patient/family education    PT Goals (Current goals can be found in the Care Plan section)  Acute Rehab PT Goals Patient Stated Goal: To go home PT Goal Formulation: With patient Time For Goal Achievement: 02/04/17 Potential to Achieve Goals:  Good    Frequency Min 3X/week   Barriers to discharge        Co-evaluation               End of Session Equipment Utilized During Treatment: Gait belt Activity Tolerance: Patient limited by fatigue Patient left: in bed;with call bell/phone within reach;with bed alarm set Nurse Communication: Mobility status PT Visit Diagnosis: Muscle weakness (generalized) (M62.81);Difficulty in walking, not elsewhere classified (R26.2)    Functional Assessment Tool Used: AM-PAC 6 Clicks Basic Mobility;Clinical judgement Functional Limitation: Mobility: Walking and moving around Mobility: Walking and Moving Around Current Status (T0354): At least 20 percent but less than 40 percent impaired, limited or restricted Mobility: Walking and Moving Around Goal Status [phone]): At least 1 percent but less than 20 percent impaired, limited or restricted    Time: 1849-1900 PT Time Calculation (min) (ACUTE ONLY): 11 min   Charges:   PT Evaluation $PT Eval Moderate Complexity: 1 Procedure     PT G Codes:   PT G-Codes **NOT FOR INPATIENT CLASS** Functional Assessment Tool Used: AM-PAC 6 Clicks Basic Mobility;Clinical judgement Functional Limitation: Mobility: Walking and moving around Mobility: Walking and Moving Around Current Status (E7517): At least 20 percent but less than 40 percent impaired, limited or restricted Mobility: Walking and Moving Around Goal Status [phone]): At least 1 percent but less than 20 percent impaired, limited or restricted     Vena Austria 01/28/2017, 7:53 PM Durenda Hurt. Renaldo Fiddler, [facility] Acute Rehab Services Pager [phone]

## 2017-01-29 MED ORDER — METOPROLOL TARTRATE 50 MG PO TABS: 25.0000 mg | ORAL_TABLET | Freq: Two times a day (BID) | ORAL | Status: DC

## 2017-01-29 MED ORDER — FLECAINIDE ACETATE 50 MG PO TABS: 50.0000 mg | ORAL_TABLET | Freq: Two times a day (BID) | ORAL | Status: DC

## 2017-01-29 NOTE — Care Management Note (Signed)
Case Management Note  Patient Details  Name: Yehoshua Vitelli MRN: [id number] Date of Birth: 12-25-1975  Subjective/Objective:                  Breakthrough seizures Action/Plan: Discharge planning Expected Discharge Date:  01/29/17               Expected Discharge Plan:  Home w Home Health Services  In-House Referral:     Discharge planning Services  CM Consult  Post Acute Care Choice:  Home Health Choice offered to:  Patient  DME Arranged:  Walker rolling DME Agency:  [facility]  HH Arranged:  PT HH Agency:  [facility] ([facility]  Status of Service:  Completed, signed off  If discussed at [employer] of Stay Meetings, dates discussed:    Additional Comments: CM spoke with pt in room to offer choice of home health agency. Pt chooses [facility] to render HHPT. Referral called to [facility] rep, Corrie Dandy.  Cm notified [facility] rep, Reggie to please deliver the rolling walker to room.  No other CM needs were communicated. Yves Dill, RN 01/29/2017, 4:00 PM

## 2017-01-29 NOTE — Progress Notes (Signed)
Neurology Progress Note  Subjective: No further seizures. The patient reports that he is sore all over. He initially denied any single area where the discomfort was worse but then later said that his left arm was the worst. He feels tired. He had a mild headache earlier this morning but this has since resolved. He slept well. He has been eating without difficulty. He is urinating without problem. He has not had a BM in four days and states that he usually has to take a laxative at home in order to move his bowels. The remainder of 12-point ROS was unremarkable.   Medications reviewed and reconciled.   Pertinent meds: Keppra 1500 mg BID Oxycodone 15 mg q8h prn  Current Meds:   Current Facility-Administered Medications:  .  0.9 %  sodium chloride infusion, 250 mL, Intravenous, PRN, Lavone Neri Opyd, MD .  acetaminophen (TYLENOL) tablet 650 mg, 650 mg, Oral, Q6H PRN, 650 mg at 01/28/17 2120 **OR** acetaminophen (TYLENOL) suppository 650 mg, 650 mg, Rectal, Q6H PRN, Lavone Neri Opyd, MD .  amLODipine (NORVASC) tablet 10 mg, 10 mg, Oral, Daily, Lavone Neri Opyd, MD, 10 mg at 01/29/17 0935 .  aspirin EC tablet 325 mg, 325 mg, Oral, Daily, Lavone Neri Opyd, MD, 325 mg at 01/29/17 1000 .  diazepam (VALIUM) tablet 10 mg, 10 mg, Oral, Q12H PRN, Briscoe Deutscher, MD, 10 mg at 01/28/17 2120 .  enoxaparin (LOVENOX) injection 40 mg, 40 mg, Subcutaneous, Q24H, Lavone Neri Opyd, MD, 40 mg at 01/29/17 4098 .  flecainide (TAMBOCOR) tablet 50 mg, 50 mg, Oral, BID, Lavone Neri Opyd, MD, 50 mg at 01/29/17 1000 .  levETIRAcetam (KEPPRA) tablet 1,500 mg, 1,500 mg, Oral, BID, Lavone Neri Opyd, MD, 1,500 mg at 01/29/17 0934 .  montelukast (SINGULAIR) tablet 10 mg, 10 mg, Oral, QHS, Lavone Neri Opyd, MD, 10 mg at 01/28/17 2120 .  ondansetron (ZOFRAN) tablet 4 mg, 4 mg, Oral, Q6H PRN **OR** ondansetron (ZOFRAN) injection 4 mg, 4 mg, Intravenous, Q6H PRN, Briscoe Deutscher, MD, 4 mg at 01/28/17 1522 .  oxyCODONE (Oxy IR/ROXICODONE) immediate  release tablet 15 mg, 15 mg, Oral, Q8H PRN, Briscoe Deutscher, MD, 15 mg at 01/29/17 0257 .  polyethylene glycol (MIRALAX / GLYCOLAX) packet 17 g, 17 g, Oral, Daily PRN, Lavone Neri Opyd, MD .  pravastatin (PRAVACHOL) tablet 20 mg, 20 mg, Oral, QHS, Lavone Neri Opyd, MD, 20 mg at 01/28/17 2119 .  sodium chloride flush (NS) 0.9 % injection 3 mL, 3 mL, Intravenous, Q12H, Lavone Neri Opyd, MD, 3 mL at 01/28/17 2200 .  sodium chloride flush (NS) 0.9 % injection 3 mL, 3 mL, Intravenous, Q12H, Lavone Neri Opyd, MD, 3 mL at 01/29/17 0934 .  sodium chloride flush (NS) 0.9 % injection 3 mL, 3 mL, Intravenous, PRN, Lavone Neri Opyd, MD  Objective:  Temp:  [97.3 F (36.3 C)-98.3 F (36.8 C)] 97.3 F (36.3 C) (02/25 0503) Pulse Rate:  [82-95] 90 (02/25 0503) Resp:  [18-20] 20 (02/25 0503) BP: (130-148)/(76-91) 146/84 (02/25 0503) SpO2:  [93 %-97 %] 94 % (02/25 0503)  General: WDWN African-American male in NAD. Alert, oriented x4. Speech is clear without dysarthria. Affect is bright. Comportment is normal.  HEENT: Neck is supple without lymphadenopathy. Mucous membranes are moist and the oropharynx is clear. Sclerae are anicteric. There is no conjunctival injection.  CV: Regular, no murmur. Carotid pulses are 2+ and symmetric with no bruits. Distal pulses 2+ and symmetric.  Lungs: CTAB  Extremities: No C/C/E. Neuro:  MS: As noted above. No aphasia.  CN: Pupils are equal and reactive from 3-->2 mm bilaterally. EOMI, no nystagmus. Facial sensation is intact to light touch. Face is symmetric at rest with normal strength and mobility. Hearing is intact to conversational voice. Voice is normal in tone and quality. Palate elevates symmetrically. Uvula is midline. Bilateral SCM and trapezii are 5/5. Tongue is midline with normal bulk and mobility.  Motor: Normal bulk, tone, and strength throughout. Effort is variable. No pronator drift. No tremor or other abnormal movements are observed.  Sensation: Intact to light touch.   DTRs: 2+, symmetric. Toes are downgoing bilaterally. No pathological reflexes.  Coordination: Finger-to-nose and heel-to-shin are without dysmetria bilaterally.    Labs: Lab Results  Component Value Date   WBC 6.3 01/27/2017   HGB 13.6 01/27/2017   HCT 43.0 01/27/2017   PLT 168 01/27/2017   GLUCOSE 109 (H) 01/27/2017   ALT 18 01/27/2017   AST 17 01/27/2017   NA 137 01/27/2017   K 3.7 01/27/2017   CL 103 01/27/2017   CREATININE 1.12 01/27/2017   BUN 8 01/27/2017   CO2 25 01/27/2017   CBC Latest Ref Rng & Units 01/27/2017 03/31/2016 03/30/2016  WBC 4.0 - 10.5 K/uL 6.3 7.3 8.7  Hemoglobin 13.0 - 17.0 g/dL 45.6 11.4(L) 11.9(L)  Hematocrit 39.0 - 52.0 % 43.0 36.2(L) 37.6(L)  Platelets 150 - 400 K/uL 168 176 168    No results found for: HGBA1C Lab Results  Component Value Date   ALT 18 01/27/2017   AST 17 01/27/2017   ALKPHOS 103 01/27/2017   BILITOT 0.6 01/27/2017    Radiology:  There is no new neuroimaging for review.   A/P:   1. Seizures: He had breakthrough seizures in the setting of known epilepsy. Last seizure was on 2/23. Continue outpatient Keppra 1500 mg BID. Seizure precautions discussed with the patient as below. He follows with Dr. Karel Jarvis and is encouraged to make an appointment with her in the next couple of weeks so that she can reassess him. He states that he is tolerating the Keppra without any side effects.   Per [facility] statutes, patients with seizures are not allowed to drive until they have been seizure-free for six months. Use caution when using heavy equipment or power tools. Avoid working on ladders or at heights. Take showers instead of baths. Ensure the water temperature is not too high on the home water heater. Do not go swimming alone. When caring for infants or small children, sit down when holding, feeding, or changing them to minimize risk of injury to the child in the event you have a seizure.   I have no additional recommendations and  will sign off. He is OK for discharge from the neurology perspective.   Rhona Leavens, MD [facility]

## 2017-01-29 NOTE — Progress Notes (Signed)
Patient with family caretaker at bedside.  Discharge instructions given.  All questions and concerns addressed.  IV catheter removed without difficulty.  Telemetry removed.

## 2017-01-29 NOTE — Discharge Instructions (Signed)
Epilepsy Epilepsy is when a person keeps having seizures. A seizure is unusual activity in the brain. A seizure can change how you think or behave, and it can make it hard to be aware of what is happening. This condition can cause problems, such as:  Falls, accidents, and injury.  Depression.  Poor memory.  Sudden unexplained death in epilepsy (SUDEP). This is rare. Its cause is not known. Most people with epilepsy lead normal lives. Follow these instructions at home: Medicines  Take medicines only as told by your doctor.  Avoid anything that may keep your medicine from working, such as alcohol. Activity  Get enough rest. Lack of sleep can make seizures more likely to occur.  Follow your doctors advice about driving, swimming, and doing anything else that would be dangerous if you had a seizure. Teaching others  Teach friends and family what to do if you have a seizure. They should:  Lay you on the ground to prevent a fall.  Cushion your head and body.  Loosen any tight clothing around your neck.  Turn you on your side.  Stay with you until you are better.  Not hold you down.  Not put anything in your mouth.  Know whether or not you need emergency care. General instructions  Avoid anything that causes you to have seizures.  Keep a seizure diary. Write down what you remember about each seizure, and especially what might have caused it.  Keep all follow-up visits as told by your doctor. This is important. Contact a doctor if:  You have a change in your seizure pattern.  You get an infection or start to feel sick. You may have more seizures when you are sick. Get help right away if:  A seizure does not stop after 5 minutes.  You have more than one seizure in a row, and you do not have enough time between the seizures to feel better.  A seizure makes it harder to breathe.  A seizure is different from other seizures you have had.  A seizure makes you unable  to speak or use a part of your body.  You did not wake up right after a seizure. This information is not intended to replace advice given to you by your health care provider. Make sure you discuss any questions you have with your health care provider. Document Released: 09/18/2009 Document Revised: 06/27/2016 Document Reviewed: 05/31/2016 Elsevier Interactive Patient Education  2017 [employer]. Epilepsy Epilepsy is when a person keeps having seizures. A seizure is unusual activity in the brain. A seizure can change how you think or behave, and it can make it hard to be aware of what is happening. This condition can cause problems, such as:  Falls, accidents, and injury.  Depression.  Poor memory.  Sudden unexplained death in epilepsy (SUDEP). This is rare. Its cause is not known. Most people with epilepsy lead normal lives. Follow these instructions at home: Medicines  Take medicines only as told by your doctor.  Avoid anything that may keep your medicine from working, such as alcohol. Activity  Get enough rest. Lack of sleep can make seizures more likely to occur.  Follow your doctors advice about driving, swimming, and doing anything else that would be dangerous if you had a seizure. Teaching others  Teach friends and family what to do if you have a seizure. They should:  Lay you on the ground to prevent a fall.  Cushion your head and body.  Loosen any  tight clothing around your neck.  Turn you on your side.  Stay with you until you are better.  Not hold you down.  Not put anything in your mouth.  Know whether or not you need emergency care. General instructions  Avoid anything that causes you to have seizures.  Keep a seizure diary. Write down what you remember about each seizure, and especially what might have caused it.  Keep all follow-up visits as told by your doctor. This is important. Contact a doctor if:  You have a change in your seizure  pattern.  You get an infection or start to feel sick. You may have more seizures when you are sick. Get help right away if:  A seizure does not stop after 5 minutes.  You have more than one seizure in a row, and you do not have enough time between the seizures to feel better.  A seizure makes it harder to breathe.  A seizure is different from other seizures you have had.  A seizure makes you unable to speak or use a part of your body.  You did not wake up right after a seizure. This information is not intended to replace advice given to you by your health care provider. Make sure you discuss any questions you have with your health care provider. Document Released: 09/18/2009 Document Revised: 06/27/2016 Document Reviewed: 05/31/2016 Elsevier Interactive Patient Education  2017 [employer].

## 2017-01-29 NOTE — Progress Notes (Signed)
Patient stated he had a "seizure" while staff was out of room.  Unwitnessed by staff.  Patient resting in bed, alert verbal.  No noted distress or discomfort.  Vital signs within normal limits.

## 2017-01-29 NOTE — Progress Notes (Signed)
Patient left unit with all belongings in wheelchair accompanied by staff.

## 2017-01-29 NOTE — Progress Notes (Signed)
Physical Therapy Treatment Patient Details Name: Adrian Carter MRN: [id number] DOB: [birth year] Today's Date: 01/29/2017    History of Present Illness Patient is a 41 yo male admitted 01/27/17 with breakthrough seizures.    PMH:  seizure disorder, autism, PAF    PT Comments    Patient making improvement with mobility and gait.  Able to ambulate 53' with RW.  Continue to recommend HHPT.   Follow Up Recommendations  Home health PT;Supervision for mobility/OOB     Equipment Recommendations  Rolling walker with 5" wheels    Recommendations for Other Services       Precautions / Restrictions Precautions Precautions: Fall Precaution Comments: Seizure disorder Restrictions Weight Bearing Restrictions: No    Mobility  Bed Mobility Overal bed mobility: Modified Independent                Transfers Overall transfer level: Needs assistance Equipment used: Rolling walker (2 wheeled) Transfers: Sit to/from Stand Sit to Stand: Supervision         General transfer comment: Assist for safety/balance.  Ambulation/Gait Ambulation/Gait assistance: Min guard Ambulation Distance (Feet): 40 Feet Assistive device: Rolling walker (2 wheeled) Gait Pattern/deviations: Step-through pattern;Decreased stride length;Shuffle Gait velocity: decreased Gait velocity interpretation: Below normal speed for age/gender General Gait Details: Verbal cues for safe use of RW.  Patient with no loss of balance during gait.  Patient c/o feeling weak and needed to sit down.  Self-limited distance to 64' with RW.   Stairs            Wheelchair Mobility    Modified Rankin (Stroke Patients Only)       Balance     Sitting balance-Leahy Scale: Good       Standing balance-Leahy Scale: Fair                      Cognition Arousal/Alertness: Awake/alert Behavior During Therapy: WFL for tasks assessed/performed;Flat affect Overall Cognitive Status: No family/caregiver present  to determine baseline cognitive functioning                      Exercises      General Comments        Pertinent Vitals/Pain Pain Assessment: Faces Faces Pain Scale: Hurts little more Pain Location: BLE's Pain Descriptors / Indicators: Sore Pain Intervention(s): Monitored during session;Repositioned    Home Living                      Prior Function            PT Goals (current goals can now be found in the care plan section) Progress towards PT goals: Progressing toward goals    Frequency    Min 3X/week      PT Plan Current plan remains appropriate    Co-evaluation             End of Session Equipment Utilized During Treatment: Gait belt Activity Tolerance: Patient limited by fatigue;Patient limited by pain Patient left: in bed;with call bell/phone within reach;with bed alarm set (Declined return to chair) Nurse Communication: Mobility status PT Visit Diagnosis: Muscle weakness (generalized) (M62.81);Difficulty in walking, not elsewhere classified (R26.2)     Time: 4034-7425 PT Time Calculation (min) (ACUTE ONLY): 10 min  Charges:  $Gait Training: 8-22 mins                    G Codes:       Vena Austria  01/29/2017, 5:05 PM Durenda Hurt. Renaldo Fiddler, [facility] Acute Rehab Services Pager [phone]

## 2017-01-29 NOTE — Discharge Summary (Signed)
Physician Discharge Summary  Adrian Carter UKG:[id number] DOB: May 13, 1976 DOA: 01/27/2017  PCP: Irena Reichmann, DO  Admit date: 01/27/2017 Discharge date: 01/29/2017  Recommendations for Outpatient Follow-up:  1. Pt will need to follow up with PCP in 2-3 weeks post discharge 2. Please obtain BMP to evaluate electrolytes and kidney function 3. Please also check CBC to evaluate Hg and Hct levels  Discharge Diagnoses:  Principal Problem:   Seizure (HCC) Active Problems:   Hyperlipidemia   Essential hypertension   Paroxysmal a-fib (HCC)   Autism   Breakthrough seizure (HCC)   Discharge Condition: Stable  Diet recommendation: Heart healthy diet discussed in details   History of present illness:  41 year old male with history of autism, seizure disorder, paroxysmal a fib who presented from [facility] hospital to [facility] for management of breakthrough seizures. He is managed with Keppra 1500 mg bID  But apparently still has seizures about once a week. Ont he day of the presentation he has had 3 seizure episodes. He reported compliance with his meds. He does say he has been under lot of stress lately.   Assessment & Plan:   Active Problems: Seizure disorder / Breakthrough seizure  - Seen by neuro in consultation - Recommendation to continue keppra at current dose 1500 mg twice a day - Dilantin level WNL - neuro cleared pt for discharge but he suddenly appeared to go into another seizure - I have examined pt at bedside and currently pt is alert but somewhat sluggish, similar to morning exam - spoke with neurology who had no further recommendations and cleared pt for discharge     Essential hypertension - Continue Norvasc 10 mg daily    Paroxysmal a-fib (HCC) - CHADS vasc score 1 - On flecainide - AC with aspirin daily     Dyslipidemia - Continue Pravachol 20 mg at bedtime    DVT prophylaxis: Lovenox subQ Code Status: full code  Family Communication: no family at the  bedside this am Disposition Plan: home   Consultants:   Neurology   PT   Procedures:   None   Antimicrobials:   None    Procedures/Studies: Mr Laqueta Jean Wo Contrast  Result Date: 01/28/2017 CLINICAL DATA:  Initial evaluation for breakthrough seizure. EXAM: MRI HEAD WITHOUT AND WITH CONTRAST TECHNIQUE: Multiplanar, multiecho pulse sequences of the brain and surrounding structures were obtained without and with intravenous contrast. CONTRAST:  36mL MULTIHANCE GADOBENATE DIMEGLUMINE 529 MG/ML IV SOLN COMPARISON:  Prior CT from 01/27/2017. FINDINGS: Brain: Study somewhat degraded by motion artifact. Cerebral volume within normal limits for age. No focal parenchymal signal abnormality identified. No abnormal foci of restricted diffusion to suggest acute or subacute ischemia. Gray-white matter differentiation maintained. No foci susceptibility artifact to suggest acute or chronic intracranial hemorrhage. No evidence for chronic infarction. No mass lesion, midline shift, or mass effect. No hydrocephalus. No extra-axial fluid collection. Major dural sinuses are grossly patent. No abnormal enhancement. No intrinsic temporal lobe abnormality. Pituitary gland and suprasellar region within normal limits. Midline structures intact and normal. Vascular: Major intracranial vascular flow voids are well preserved and are normal. Right vertebral artery is poorly visualized, likely diminutive/ hypoplastic. Skull and upper cervical spine: Craniocervical junction within normal limits. Visualized upper cervical spine unremarkable. Bone marrow signal intensity within normal limits. No scalp soft tissue abnormality. Sinuses/Orbits: Globes and orbital soft tissues within normal limits. Paranasal sinuses are clear. No mastoid effusion. Inner ear structures normal. Other: No other significant finding. IMPRESSION: Normal brain MRI.  No  acute intracranial process identified. Electronically Signed   By: Rise Mu M.D.   On: 01/28/2017 01:32     Discharge Exam: Vitals:   01/29/17 0503 01/29/17 0956  BP: (!) 146/84 126/83  Pulse: 90 98  Resp: 20 19  Temp: 97.3 F (36.3 C) 98.1 F (36.7 C)   Vitals:   01/28/17 2142 01/29/17 0122 01/29/17 0503 01/29/17 0956  BP: (!) 146/90 137/85 (!) 146/84 126/83  Pulse: 95 91 90 98  Resp: 20 20 20 19   Temp: 98 F (36.7 C) 97.9 F (36.6 C) 97.3 F (36.3 C) 98.1 F (36.7 C)  TempSrc: Oral Oral Oral Oral  SpO2: 93% 94% 94% 97%  Weight:      Height:        General: Pt is alert, sluggish but following commands appropriately  Cardiovascular: Regular rate and rhythm, S1/S2 +, no murmurs, no rubs, no gallops Respiratory: Clear to auscultation bilaterally, no wheezing, no crackles, no rhonchi Abdominal: Soft, non tender, non distended, bowel sounds +, no guarding  Discharge Instructions  Discharge Instructions    Diet - low sodium heart healthy    Complete by:  As directed    Increase activity slowly    Complete by:  As directed      Allergies as of 01/29/2017      Reactions   Morphine And Related Anaphylaxis   Penicillins Anaphylaxis   Has patient had a PCN reaction causing immediate rash, facial/tongue/throat swelling, SOB or lightheadedness with hypotension: No Has patient had a PCN reaction causing severe rash involving mucus membranes or skin necrosis: No Has patient had a PCN reaction that required hospitalization No Has patient had a PCN reaction occurring within the last 10 years: No If all of the above answers are "NO", then may proceed with Cephalosporin use.   Lisinopril Cough      Medication List    TAKE these medications   amLODipine 10 MG tablet Commonly known as:  NORVASC Take 1 tablet (10 mg total) by mouth daily. What changed:  how much to take   aspirin EC 81 MG tablet Take 81 mg by mouth daily.   diazepam 10 MG tablet Commonly known as:  VALIUM Take one tablet twice per day as needed for anxiety    flecainide 50 MG tablet Commonly known as:  TAMBOCOR Take 1 tablet (50 mg total) by mouth 2 (two) times daily.   levETIRAcetam 1000 MG tablet Commonly known as:  KEPPRA Take 1 & 1/2 tablets twice a day   metoprolol 50 MG tablet Commonly known as:  LOPRESSOR Take 0.5 tablets (25 mg total) by mouth 2 (two) times daily.   montelukast 10 MG tablet Commonly known as:  SINGULAIR Take 10 mg by mouth at bedtime.   oxyCODONE 15 MG immediate release tablet Commonly known as:  ROXICODONE Take 15 mg by mouth every 8 (eight) hours as needed for pain.   pravastatin 20 MG tablet Commonly known as:  PRAVACHOL Take 20 mg by mouth at bedtime.      Follow-up Information    [name], [name], DO Follow up.   Specialty:  Family Medicine Contact information: [address] [place] [address] [phone]            The results of significant diagnostics from this hospitalization (including imaging, microbiology, ancillary and laboratory) are listed below for reference.     Microbiology: No results found for this or any previous visit (from the past 240 hour(s)).  Labs: Basic Metabolic Panel:  Recent Labs Lab 01/27/17 2325  NA 137  K 3.7  CL 103  CO2 25  GLUCOSE 109*  BUN 8  CREATININE 1.12  CALCIUM 8.8*  MG 1.8   Liver Function Tests:  Recent Labs Lab 01/27/17 2325  AST 17  ALT 18  ALKPHOS 103  BILITOT 0.6  PROT 6.7  ALBUMIN 3.6   CBC:  Recent Labs Lab 01/27/17 2325  WBC 6.3  NEUTROABS 4.0  HGB 13.6  HCT 43.0  MCV 70.6*  PLT 168    SIGNED: Time coordinating discharge:  30 minutes  Debbora Presto, MD  [facility] 01/29/2017, 10:33 AM Pager [phone]  If 7PM-7AM, please contact night-coverage [email or web address] Password [email or web address]

## 2017-02-01 ENCOUNTER — Other Ambulatory Visit: Payer: Self-pay | Admitting: Neurology

## 2017-02-01 DIAGNOSIS — G40209 Localization-related (focal) (partial) symptomatic epilepsy and epileptic syndromes with complex partial seizures, not intractable, without status epilepticus: Secondary | ICD-10-CM

## 2017-02-01 NOTE — Telephone Encounter (Signed)
Adrian Carter (per staff message request) for 24 hour ambulatory EEG. Offered today 02/01/2017 @11 :00am, offered tomorrow 02/02/17@11am  or 1pm and offered Monday 02/07/2017@9 :30am. She was not able to commit to any of these appointments due to other clients. 04/09/2017 will speak to his mother to see if she can bring him and call Mardella Layman back.

## 2017-02-01 NOTE — Telephone Encounter (Signed)
Per provider - ordered 24 hour EEG.

## 2017-02-02 ENCOUNTER — Ambulatory Visit (INDEPENDENT_AMBULATORY_CARE_PROVIDER_SITE_OTHER): Payer: Medicare Other | Admitting: Neurology

## 2017-02-02 DIAGNOSIS — G40209 Localization-related (focal) (partial) symptomatic epilepsy and epileptic syndromes with complex partial seizures, not intractable, without status epilepticus: Secondary | ICD-10-CM

## 2017-02-02 DIAGNOSIS — R569 Unspecified convulsions: Secondary | ICD-10-CM

## 2017-02-02 DIAGNOSIS — G40009 Localization-related (focal) (partial) idiopathic epilepsy and epileptic syndromes with seizures of localized onset, not intractable, without status epilepticus: Secondary | ICD-10-CM

## 2017-02-14 NOTE — Procedures (Signed)
ELECTROENCEPHALOGRAM REPORT  Dates of Recording: 02/02/2017 11:44 AM to 02/03/2017 10:51 AM  Patient's Name: Adrian Carter MRN: [id number] Date of Birth: 1976-03-19  Referring Provider: Dr. Patrcia Dolly  Procedure: 24-hour ambulatory EEG  History: This is a 41 year old man with recurrent seizures on Keppra. EEG for classification.  Medications: Keppra, Topamax, Norvasc, Valium, Tambocor, Lopressor, Pravachol, Roxicodone  Technical Summary: This is a 24-hour multichannel digital EEG recording measured by the international 10-20 system with electrodes applied with paste and impedances below 5000 ohms performed as portable with EKG monitoring.  The digital EEG was referentially recorded, reformatted, and digitally filtered in a variety of bipolar and referential montages for optimal display.    DESCRIPTION OF RECORDING: During maximal wakefulness, the background activity consisted of a symmetric 10 Hz posterior dominant rhythm which was reactive to eye opening.  There were no epileptiform discharges or focal slowing seen in wakefulness.  During the recording, the patient progresses through wakefulness, drowsiness, and Stage 2 sleep.  Again, there were no epileptiform discharges seen.  Events: Patient did not complete diary. Reported a headache on 3/1 at 1738 hours, no epileptiform correlate seen.   There were no electrographic seizures seen.  EKG lead was unremarkable.  IMPRESSION: This 24-hour ambulatory EEG study is normal.    CLINICAL CORRELATION: A normal EEG does not exclude a clinical diagnosis of epilepsy. Typical events were not captured. If further clinical questions remain, inpatient video EEG monitoring may be helpful.   Patrcia Dolly, M.D.

## 2017-02-16 ENCOUNTER — Telehealth: Payer: Self-pay

## 2017-02-16 MED ORDER — TOPIRAMATE 25 MG PO TABS: 25.0000 mg | ORAL_TABLET | Freq: Two times a day (BID) | ORAL | 6 refills | Status: DC

## 2017-02-16 NOTE — Telephone Encounter (Signed)
Clld pt  - LMOVMTC re EEG results.  Electronically sent in Topamax 25 mg 1 tablet BID #60 R6 to [facility] [place].

## 2017-02-16 NOTE — Telephone Encounter (Signed)
-----   Message from Van Clines, MD sent at 02/14/2017  3:18 PM EDT ----- Pls let patient/caregiver/mother know the EEG is normal. He is taking Keppra 1500mg  BID, but I told ER doctor to add on Topamax 25mg  BID. Not sure if he is taking it, if not, pls have caregiver start Topamax 25mg  BID in addition to the Keppra 1500mg  BID. Pls send Rx for Topamax 25mg  BID #60 with 6 refills, then follow-up with me as scheduled in May. Thanks

## 2017-02-18 ENCOUNTER — Observation Stay (HOSPITAL_COMMUNITY)
Admission: AD | Admit: 2017-02-18 | Discharge: 2017-02-19 | Disposition: A | Discharge status: Home / Self Care | Payer: Medicare Other | Source: Other Acute Inpatient Hospital | Attending: Internal Medicine | Admitting: Internal Medicine

## 2017-02-18 ENCOUNTER — Encounter (HOSPITAL_COMMUNITY): Payer: Self-pay | Admitting: Family Medicine

## 2017-02-18 DIAGNOSIS — Z880 Allergy status to penicillin: Secondary | ICD-10-CM | POA: Insufficient documentation

## 2017-02-18 DIAGNOSIS — R569 Unspecified convulsions: Secondary | ICD-10-CM

## 2017-02-18 DIAGNOSIS — Z7982 Long term (current) use of aspirin: Secondary | ICD-10-CM | POA: Diagnosis not present

## 2017-02-18 DIAGNOSIS — I10 Essential (primary) hypertension: Secondary | ICD-10-CM | POA: Diagnosis not present

## 2017-02-18 DIAGNOSIS — Z888 Allergy status to other drugs, medicaments and biological substances status: Secondary | ICD-10-CM | POA: Insufficient documentation

## 2017-02-18 DIAGNOSIS — F419 Anxiety disorder, unspecified: Secondary | ICD-10-CM | POA: Insufficient documentation

## 2017-02-18 DIAGNOSIS — Z885 Allergy status to narcotic agent status: Secondary | ICD-10-CM | POA: Insufficient documentation

## 2017-02-18 DIAGNOSIS — R531 Weakness: Secondary | ICD-10-CM | POA: Insufficient documentation

## 2017-02-18 DIAGNOSIS — G894 Chronic pain syndrome: Secondary | ICD-10-CM | POA: Diagnosis not present

## 2017-02-18 DIAGNOSIS — G40209 Localization-related (focal) (partial) symptomatic epilepsy and epileptic syndromes with complex partial seizures, not intractable, without status epilepticus: Principal | ICD-10-CM | POA: Insufficient documentation

## 2017-02-18 DIAGNOSIS — I480 Paroxysmal atrial fibrillation: Secondary | ICD-10-CM | POA: Insufficient documentation

## 2017-02-18 DIAGNOSIS — G40919 Epilepsy, unspecified, intractable, without status epilepticus: Secondary | ICD-10-CM

## 2017-02-18 DIAGNOSIS — Z79899 Other long term (current) drug therapy: Secondary | ICD-10-CM | POA: Insufficient documentation

## 2017-02-18 DIAGNOSIS — F840 Autistic disorder: Secondary | ICD-10-CM | POA: Diagnosis not present

## 2017-02-18 DIAGNOSIS — Z8249 Family history of ischemic heart disease and other diseases of the circulatory system: Secondary | ICD-10-CM | POA: Diagnosis not present

## 2017-02-18 LAB — CBC
HCT: 42.1 % (ref 39.0–52.0)
HEMOGLOBIN: 13.5 g/dL (ref 13.0–17.0)
MCH: 22.8 pg — ABNORMAL LOW (ref 26.0–34.0)
MCHC: 32.1 g/dL (ref 30.0–36.0)
MCV: 71 fL — ABNORMAL LOW (ref 78.0–100.0)
PLATELETS: 186 10*3/uL (ref 150–400)
RBC: 5.93 MIL/uL — AB (ref 4.22–5.81)
RDW: 18 % — ABNORMAL HIGH (ref 11.5–15.5)
WBC: 7.7 10*3/uL (ref 4.0–10.5)

## 2017-02-18 LAB — BASIC METABOLIC PANEL
Anion gap: 8 (ref 5–15)
BUN: 8 mg/dL (ref 6–20)
CALCIUM: 9 mg/dL (ref 8.9–10.3)
CO2: 25 mmol/L (ref 22–32)
Chloride: 105 mmol/L (ref 101–111)
Creatinine, Ser: 0.96 mg/dL (ref 0.61–1.24)
GFR calc non Af Amer: >60 mL/min (ref >60)
Glucose, Bld: 88 mg/dL (ref 65–99)
POTASSIUM: 3.9 mmol/L (ref 3.5–5.1)
SODIUM: 138 mmol/L (ref 135–145)

## 2017-02-18 MED ORDER — TOPIRAMATE 25 MG PO TABS: 25.0000 mg | ORAL_TABLET | Freq: Two times a day (BID) | ORAL | Status: DC

## 2017-02-18 MED ORDER — ASPIRIN EC 81 MG PO TBEC
81.0000 mg | DELAYED_RELEASE_TABLET | Freq: Every day | ORAL | Status: DC
  Administered 2017-02-18 – 2017-02-19 (×2): 81 mg via ORAL
  Filled 2017-02-18 (×2): qty 1

## 2017-02-18 MED ORDER — TOPIRAMATE 25 MG PO TABS
25.0000 mg | ORAL_TABLET | Freq: Two times a day (BID) | ORAL | Status: DC
  Administered 2017-02-18 – 2017-02-19 (×3): 25 mg via ORAL
  Filled 2017-02-18 (×3): qty 1

## 2017-02-18 MED ORDER — OXYCODONE HCL 5 MG PO TABS
15.0000 mg | ORAL_TABLET | Freq: Four times a day (QID) | ORAL | Status: DC | PRN
  Administered 2017-02-18 (×2): 15 mg via ORAL
  Filled 2017-02-18 (×2): qty 3

## 2017-02-18 MED ORDER — FLECAINIDE ACETATE 50 MG PO TABS
50.0000 mg | ORAL_TABLET | Freq: Two times a day (BID) | ORAL | Status: DC
  Administered 2017-02-18 – 2017-02-19 (×3): 50 mg via ORAL
  Filled 2017-02-18 (×4): qty 1

## 2017-02-18 MED ORDER — DIAZEPAM 5 MG PO TABS: 10.0000 mg | ORAL_TABLET | Freq: Two times a day (BID) | ORAL | Status: DC | PRN

## 2017-02-18 MED ORDER — PRAVASTATIN SODIUM 20 MG PO TABS
20.0000 mg | ORAL_TABLET | Freq: Every day | ORAL | Status: DC
  Administered 2017-02-18: 20 mg via ORAL
  Filled 2017-02-18: qty 1

## 2017-02-18 MED ORDER — ACETAMINOPHEN 650 MG RE SUPP: 650.0000 mg | Freq: Four times a day (QID) | RECTAL | Status: DC | PRN

## 2017-02-18 MED ORDER — ACETAMINOPHEN 325 MG PO TABS: 650.0000 mg | ORAL_TABLET | Freq: Four times a day (QID) | ORAL | Status: DC | PRN

## 2017-02-18 MED ORDER — AMLODIPINE BESYLATE 5 MG PO TABS
5.0000 mg | ORAL_TABLET | Freq: Every day | ORAL | Status: DC
  Administered 2017-02-18 – 2017-02-19 (×2): 5 mg via ORAL
  Filled 2017-02-18 (×2): qty 1

## 2017-02-18 MED ORDER — LEVETIRACETAM 750 MG PO TABS
1500.0000 mg | ORAL_TABLET | Freq: Two times a day (BID) | ORAL | Status: DC
  Administered 2017-02-18 – 2017-02-19 (×3): 1500 mg via ORAL
  Filled 2017-02-18 (×3): qty 2

## 2017-02-18 MED ORDER — ENOXAPARIN SODIUM 40 MG/0.4ML ~~LOC~~ SOLN
40.0000 mg | Freq: Every day | SUBCUTANEOUS | Status: DC
  Administered 2017-02-18 – 2017-02-19 (×2): 40 mg via SUBCUTANEOUS
  Filled 2017-02-18 (×2): qty 0.4

## 2017-02-18 MED ORDER — METOPROLOL TARTRATE 25 MG PO TABS
25.0000 mg | ORAL_TABLET | Freq: Two times a day (BID) | ORAL | Status: DC
  Administered 2017-02-18 – 2017-02-19 (×2): 25 mg via ORAL
  Filled 2017-02-18 (×3): qty 1

## 2017-02-18 NOTE — Progress Notes (Signed)
New patient admitted from [facility] with seizures. On arrival, patient verbal and oriented but has some memory impairment. Vital signs taken and stable. Placed on cardiac Monitor and notified CCMD. Assigned MD notified. Oriented to the room and made comfortable in bed. Will keep monitoring.

## 2017-02-18 NOTE — H&P (Signed)
History and Physical  Patient Name: Adrian Carter     IWP:[id number]    DOB: Aug 08, 1976    DOA: 02/18/2017 PCP: Irena Reichmann, DO   Patient coming from: Home --> [facility]  Chief Complaint: Seizures  HPI: Adrian Carter is a 41 y.o. male with a past medical history significant for seizures, autism, Afib on flecainide no AC, and HTN who presents with seizures.  The patient established with Dr. Karel Jarvis last fall, had increased Keppra to 1500 BID in the fall, and recently added Topamax, in the last week, which he tells me he hadn't started yet.    Tonight, he was at church listening to the preacher when he "just fell out".  He tells me that he had "9 seizures" at church, but that he can't remember any details other than that he hit his head on a metal chair on the way down and that he is weak all over now.  He has had no fever, chills, cough, sputum production, dysuria, urinary frequency.  In the ER he was evaluated, and they were preparing for discharge when he had generalized shaking witnessed by family, EDP noted he was grasping the bedrails during it, and also that he was able to grip the EDP's hands.    ED course: -Afebrile, heart rate 80, respirations 18, BP 137/82, 95% on room air -Na 141, K 4.0, Cr 1.1 -The case was discussed with Neurology at [facility] who recommended Fosphenytoin load 1500 (which happened at 0300) and agreed to evaluate the patient and so [facility] were asked to accept in transfer      ROS: Review of Systems  Constitutional: Negative for chills and fever.  Respiratory: Negative for cough, sputum production and shortness of breath.   Cardiovascular: Negative for chest pain.  Genitourinary: Negative for dysuria, frequency and urgency.  Neurological: Positive for seizures, loss of consciousness and headaches.  All other systems reviewed and are negative.         Past Medical History:  Diagnosis Date  . Autism   . PAF (paroxysmal atrial fibrillation) (HCC)   .  Seizure [facility])     Past Surgical History:  Procedure Laterality Date  . FOOT SURGERY    . TONSILLECTOMY AND ADENOIDECTOMY      Social History: Patient lives with his mother.  The patient walks unassited.  He does not smoke.    Allergies  Allergen Reactions  . Morphine And Related Anaphylaxis  . Penicillins Anaphylaxis    Has patient had a PCN reaction causing immediate rash, facial/tongue/throat swelling, SOB or lightheadedness with hypotension: No Has patient had a PCN reaction causing severe rash involving mucus membranes or skin necrosis: No Has patient had a PCN reaction that required hospitalization No Has patient had a PCN reaction occurring within the last 10 years: No If all of the above answers are "NO", then may proceed with Cephalosporin use.  Marland Kitchen Lisinopril Cough    Family history: family history includes Heart failure in his father; Hypertension in his mother.  Prior to Admission medications   Medication Sig Start Date End Date Taking? Authorizing Provider  amLODipine (NORVASC) 10 MG tablet Take 1 tablet (10 mg total) by mouth daily. Patient taking differently: Take 5 mg by mouth daily.  04/02/16   Leroy Sea, MD  aspirin EC 81 MG tablet Take 81 mg by mouth daily.     Historical Provider, MD  diazepam (VALIUM) 10 MG tablet Take one tablet twice per day as needed for anxiety 09/28/16  Historical Provider, MD  flecainide (TAMBOCOR) 50 MG tablet Take 1 tablet (50 mg total) by mouth 2 (two) times daily. 01/29/17   Dorothea Ogle, MD  levETIRAcetam (KEPPRA) 1000 MG tablet Take 1 & 1/2 tablets twice a day 01/13/17   Van Clines, MD  metoprolol (LOPRESSOR) 50 MG tablet Take 0.5 tablets (25 mg total) by mouth 2 (two) times daily. 01/29/17   Dorothea Ogle, MD  montelukast (SINGULAIR) 10 MG tablet Take 10 mg by mouth at bedtime.    Historical Provider, MD  oxyCODONE (ROXICODONE) 15 MG immediate release tablet Take 15 mg by mouth every 8 (eight) hours as needed for pain.     Historical Provider, MD  pravastatin (PRAVACHOL) 20 MG tablet Take 20 mg by mouth at bedtime.    Historical Provider, MD  topiramate (TOPAMAX) 25 MG tablet Take 1 tablet (25 mg total) by mouth 2 (two) times daily. 02/16/17   Van Clines, MD       Physical Exam: BP 126/84 (BP Location: Right Arm)   Pulse (!) 47   Temp 97.8 F (36.6 C) (Oral)   Resp 18   Ht 6\' 1"  (1.854 m)   Wt 95.1 kg (209 lb 11.2 oz)   SpO2 99%   BMI 27.67 kg/m  General appearance: Well-developed, adult male, alert and in no acute distress, slowed responses.   Eyes: Anicteric, conjunctiva pink, lids and lashes normal. PERRL.    ENT: No nasal deformity, discharge, epistaxis.  Hearing normal. OP moist without lesions.   Neck: Trachea midline.  No thyromegaly/tenderness. Lymph: No cervical or supraclavicular lymphadenopathy. Skin: Warm and dry.  No jaundice.  No suspicious rashes or lesions. Cardiac: RRR, nl S1-S2, no murmurs appreciated.  Capillary refill is brisk.  JVP normal.  No LE edema.  Radial and DP pulses 2+ and symmetric. Respiratory: Normal respiratory rate and rhythm.  CTAB without rales or wheezes. Abdomen: Abdomen soft.  No TTP. No ascites, distension, hepatosplenomegaly.   MSK: No deformities or effusions.  No cyanosis or clubbing. Neuro: Cranial nerves normal.  Sensation intact to light touch. Speech is fluent.  Muscle strength normal.    Psych: Sensorium intact and responding to questions, attention normal.  Behavior appropriate.  Affect normal.  Judgment and insight appear normal.     Labs on Admission:  I have personally reviewed following labs and imaging studies: CBC: No results for input(s): WBC, NEUTROABS, HGB, HCT, MCV, PLT in the last 168 hours. Basic Metabolic Panel: No results for input(s): NA, K, CL, CO2, GLUCOSE, BUN, CREATININE, CALCIUM, MG, PHOS in the last 168 hours. GFR: CrCl cannot be calculated (Patient's most recent lab result is older than the maximum 21 days  allowed.).  Liver Function Tests: No results for input(s): AST, ALT, ALKPHOS, BILITOT, PROT, ALBUMIN in the last 168 hours. No results for input(s): LIPASE, AMYLASE in the last 168 hours. No results for input(s): AMMONIA in the last 168 hours. Coagulation Profile: No results for input(s): INR, PROTIME in the last 168 hours. Cardiac Enzymes: No results for input(s): CKTOTAL, CKMB, CKMBINDEX, TROPONINI in the last 168 hours. BNP (last 3 results) No results for input(s): PROBNP in the last 8760 hours. HbA1C: No results for input(s): HGBA1C in the last 72 hours. CBG: No results for input(s): GLUCAP in the last 168 hours. Lipid Profile: No results for input(s): CHOL, HDL, LDLCALC, TRIG, CHOLHDL, LDLDIRECT in the last 72 hours. Thyroid Function Tests: No results for input(s): TSH, T4TOTAL, FREET4, T3FREE, THYROIDAB in the  last 72 hours. Anemia Panel: No results for input(s): VITAMINB12, FOLATE, FERRITIN, TIBC, IRON, RETICCTPCT in the last 72 hours. Sepsis Labs: Invalid input(s): PROCALCITONIN, LACTICIDVEN No results found for this or any previous visit (from the past 240 hour(s)).         Assessment/Plan Principal Problem:   Breakthrough seizure (HCC) Active Problems:   Seizure (HCC)   Essential hypertension   Paroxysmal a-fib (HCC)   Autism  1. Epilepsy with breakthrough seizures:    -Continue Keppra 1500 BID -Hold Topamax (patient had not yet started) -Consult to Neurology, appreciate cares -Check CBC, monitor for fever   2. Atrial fibrillation:  CHAD2Vasc 1, on aspirin only. -Continue aspirin -Continue flecainide and metoprolol  3. Hypertension:  -Continue amlodipine, statin  4. Chronic pain:  -Continue oxycodone for pain  5. Other medications:  -Continue diazepam for anxiety         DVT prophylaxis: Lovenox  Code Status: FULL  Family Communication: None present  Disposition Plan: Anticipate Neurology evaluation and further disposition per  Neuro. Consults called: None overnight Admission status: OBS At the point of initial evaluation, it is my clinical opinion that admission for OBSERVATION is reasonable and necessary because the patient's presenting complaints in the context of their chronic conditions represent sufficient risk of deterioration or significant morbidity to constitute reasonable grounds for close observation in the hospital setting, but that the patient may be medically stable for discharge from the hospital within 24 to 48 hours.    Medical decision making: Patient seen at 5:00 AM on 02/18/2017.  The patient was discussed with Dr. Particia Lather at [facility].  What exists of the patient's chart was reviewed in depth and summarized above.  Clinical condition: stable.        Alberteen Sam [facility] Pager [phone]

## 2017-02-18 NOTE — Consult Note (Signed)
Neurology Consultation Reason for Consult: Increased seizure frequency Referring Physician: Maryfrances Bunnell, C  CC: Increased seizure frequency  History is obtained from: Patient  HPI: Adrian Carter is a 41 y.o. male with a history of seizures and autism who is being evaluated by Dr. Karel Jarvis. Currently he is alone with no other caregivers to provide history and I think he is only a marginal historian. He has had multiple seizure clusters over the past few months. He states that he feels dizzy, then sees lights and then "passes out."   He also reports multiple episodes of feeling dizzy throughout the week. They often occur after waking up from a nap.  He estimates he had a bout 8 seizures today.  Of note, he recently had an overnight EEG which was normal, but did not capture any of his spells.  I was called by the emergency room physician that Adrian Carter reports that he was continuing to have seizures in the ER. He apparently, however, was able to grasp bed rails during the seizure and able to grip the EDP's hands  Given that he had responded well the fosphenytoin in the past I advised a load of this and he has not had any further seizure activity since.  He apparently has not actually started his Topamax.  ROS: A 14 point ROS was performed and is negative except as noted in the HPI.   Past Medical History:  Diagnosis Date  . Autism   . PAF (paroxysmal atrial fibrillation) (HCC)   . Seizure [facility])      Family History  Problem Relation Age of Onset  . Hypertension Mother   . Heart failure Father      Social History:  reports that he has never smoked. He has never used smokeless tobacco. He reports that he does not drink alcohol or use drugs.   Exam: Current vital signs: BP 126/84 (BP Location: Right Arm)   Pulse (!) 47   Temp 97.8 F (36.6 C) (Oral)   Resp 18   Ht 6\' 1"  (1.854 m)   Wt 95.1 kg (209 lb 11.2 oz)   SpO2 99%   BMI 27.67 kg/m  Vital signs in last 24 hours: Temp:   [97.8 F (36.6 C)] 97.8 F (36.6 C) (03/17 0431) Pulse Rate:  [47] 47 (03/17 0431) Resp:  [18] 18 (03/17 0431) BP: (126)/(84) 126/84 (03/17 0431) SpO2:  [99 %] 99 % (03/17 0431) Weight:  [95.1 kg (209 lb 11.2 oz)] 95.1 kg (209 lb 11.2 oz) (03/17 0431)   Physical Exam  Constitutional: Appears well-developed and well-nourished.  Psych: Affect appropriate to situation Eyes: No scleral injection HENT: No OP obstrucion Head: Normocephalic.  Cardiovascular: Normal rate and regular rhythm.  Respiratory: Effort normal and breath sounds normal to anterior ascultation GI: Soft.  No distension. There is no tenderness.  Skin: WDI  Neuro: Mental Status: Patient is awake, alert, oriented to person, place, month, year, and situation. Patient is able to give a clear and coherent history. No signs of aphasia or neglect He appears cognitively slow Cranial Nerves: II: Visual Fields are full. Pupils are equal, round, and reactive to light.   III,IV, VI: EOMI without ptosis or diploplia.  V: Facial sensation is symmetric to temperature VII: Facial movement is symmetric.  VIII: hearing is intact to voice X: Uvula elevates symmetrically XI: Shoulder shrug is symmetric. XII: tongue is midline without atrophy or fasciculations.  Motor: Tone is normal. Bulk is normal. 5/5 strength was present in all four extremities.  Sensory: Sensation is symmetric to light touch and temperature in the arms and legs. Cerebellar: FNF and HKS are intact bilaterally   I have reviewed labs in epic and the results pertinent to this consultation are: Na 141, K 4.0, Cr 1.1  I have reviewed the images obtained: MRI brain 01/28/17-unremarkable  Impression: 41 year old male with increased seizure frequency and increase frequency of flurries over the past few months. 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 Given that he had not started Topamax, I don't think that this can be counted as a failure and we could go ahead and start this.  Given that he is on flecainide, I would favor avoiding long-term phenytoin.  Recommendations: 1) Keppra 1500 mg twice a day 2) Topamax 25 mg twice a day   Ritta Slot, MD [facility] [phone]  If 7pm- 7am, please page neurology on call as listed in AMION.

## 2017-02-18 NOTE — Progress Notes (Signed)
Patient complains of itching "all over" .  No noted rash, no swelling at mouth, lips, tongue.  Patient denies shortness of breath.  Breath sound clear bilateral.  Will continue to monitor.

## 2017-02-18 NOTE — Progress Notes (Signed)
PROGRESS NOTE        PATIENT DETAILS Name: Adrian Carter Age: 41 y.o. Sex: male Date of Birth: [birth year] Admit Date: 02/18/2017 Admitting Physician Alberteen Sam, MD NUU:VOZDGUY, [name], DO  Brief Narrative: Patient is a 41 y.o. male with history of seizures, autism, admitted to the hospital early this morning for evaluation of multiple breakthrough seizures. See below for further details  Subjective: Awake and alert-no further seizures since admission.  Assessment/Plan: Known history of seizures-with multiple breakthrough seizures over the past one week: Neurology evaluation in progress, continue Keppra and Topamax.  Paroxysmal atrial fibrillation: Continue flecainide and metoprolol-CHA2DSVASC of 1-continue ASA.Follows with Dr Tomie [place].  Hypertension: Controlled. Continue amlodipine and metoprolol.  Chronic pain syndrome: Continue with oxycodone as needed.  History of anxiety: Continue Valium as needed. Appears stable.  DVT Prophylaxis: Prophylactic Lovenox   Code Status: Full code   Family Communication: None at bedside  Disposition Plan: Remain inpatient-home in the next few days  Antimicrobial agents: Anti-infectives    None     Procedures: None  CONSULTS:  neurology  Time spent: 25-minutes-Greater than 50% of this time was spent in counseling, explanation of diagnosis, planning of further management, and coordination of care.  MEDICATIONS: Scheduled Meds: . amLODipine  5 mg Oral Daily  . aspirin EC  81 mg Oral Daily  . enoxaparin (LOVENOX) injection  40 mg Subcutaneous Daily  . flecainide  50 mg Oral BID  . levETIRAcetam  1,500 mg Oral BID  . metoprolol  25 mg Oral BID  . pravastatin  20 mg Oral QHS  . topiramate  25 mg Oral BID   Continuous Infusions: PRN Meds:.acetaminophen **OR** acetaminophen, diazepam, oxyCODONE   PHYSICAL EXAM: Vital signs: Vitals:   02/18/17 0431 02/18/17 1000  BP: 126/84  (!) 144/90  Pulse: (!) 47 61  Resp: 18 20  Temp: 97.8 F (36.6 C) 97.6 F (36.4 C)  TempSrc: Oral Oral  SpO2: 99% 100%  Weight: 95.1 kg (209 lb 11.2 oz)   Height: 6\' 1"  (1.854 m)    Filed Weights   02/18/17 0431  Weight: 95.1 kg (209 lb 11.2 oz)   Body mass index is 27.67 kg/m.   General appearance :Awake, alert, not in any distress.  Eyes:, pupils equally reactive to light and accomodation,no scleral icterus. HEENT: Atraumatic and Normocephalic Neck: supple, no JVD. No cervical lymphadenopathy.  Resp:Good air entry bilaterally, no added sounds  CVS: S1 S2 regular, no murmurs.  GI: Bowel sounds present, Non tender and not distended with no gaurding, rigidity or rebound. Extremities: B/L Lower Ext shows no edema, both legs are warm to touch Neurology:  speech clear,Non focal, sensation is grossly intact. Psychiatric: Normal judgment and insight. Alert and oriented x 3.  Musculoskeletal:No digital cyanosis Skin:No Rash, warm and dry Wounds:N/A  I have personally reviewed following labs and imaging studies  LABORATORY DATA: CBC:  Recent Labs Lab 02/18/17 0939  WBC 7.7  HGB 13.5  HCT 42.1  MCV 71.0*  PLT 186    Basic Metabolic Panel:  Recent Labs Lab 02/18/17 0939  NA 138  K 3.9  CL 105  CO2 25  GLUCOSE 88  BUN 8  CREATININE 0.96  CALCIUM 9.0    GFR: Estimated Creatinine Clearance: 117.9 mL/min (by C-G formula based on SCr of 0.96 mg/dL).  Liver Function Tests: No results for  input(s): AST, ALT, ALKPHOS, BILITOT, PROT, ALBUMIN in the last 168 hours. No results for input(s): LIPASE, AMYLASE in the last 168 hours. No results for input(s): AMMONIA in the last 168 hours.  Coagulation Profile: No results for input(s): INR, PROTIME in the last 168 hours.  Cardiac Enzymes: No results for input(s): CKTOTAL, CKMB, CKMBINDEX, TROPONINI in the last 168 hours.  BNP (last 3 results) No results for input(s): PROBNP in the last 8760 hours.  HbA1C: No  results for input(s): HGBA1C in the last 72 hours.  CBG: No results for input(s): GLUCAP in the last 168 hours.  Lipid Profile: No results for input(s): CHOL, HDL, LDLCALC, TRIG, CHOLHDL, LDLDIRECT in the last 72 hours.  Thyroid Function Tests: No results for input(s): TSH, T4TOTAL, FREET4, T3FREE, THYROIDAB in the last 72 hours.  Anemia Panel: No results for input(s): VITAMINB12, FOLATE, FERRITIN, TIBC, IRON, RETICCTPCT in the last 72 hours.  Urine analysis:    Component Value Date/Time   COLORURINE YELLOW 03/29/2016 1425   APPEARANCEUR CLEAR 03/29/2016 1425   LABSPEC 1.019 03/29/2016 1425   PHURINE 6.0 03/29/2016 1425   GLUCOSEU NEGATIVE 03/29/2016 1425   HGBUR LARGE (A) 03/29/2016 1425   BILIRUBINUR SMALL (A) 03/29/2016 1425   KETONESUR 40 (A) 03/29/2016 1425   PROTEINUR NEGATIVE 03/29/2016 1425   NITRITE NEGATIVE 03/29/2016 1425   LEUKOCYTESUR NEGATIVE 03/29/2016 1425    Sepsis Labs: Lactic Acid, Venous No results found for: LATICACIDVEN  MICROBIOLOGY: No results found for this or any previous visit (from the past 240 hour(s)).  RADIOLOGY STUDIES/RESULTS: Mr Laqueta Jean DI Contrast  Result Date: 01/28/2017 CLINICAL DATA:  Initial evaluation for breakthrough seizure. EXAM: MRI HEAD WITHOUT AND WITH CONTRAST TECHNIQUE: Multiplanar, multiecho pulse sequences of the brain and surrounding structures were obtained without and with intravenous contrast. CONTRAST:  53mL MULTIHANCE GADOBENATE DIMEGLUMINE 529 MG/ML IV SOLN COMPARISON:  Prior CT from 01/27/2017. FINDINGS: Brain: Study somewhat degraded by motion artifact. Cerebral volume within normal limits for age. No focal parenchymal signal abnormality identified. No abnormal foci of restricted diffusion to suggest acute or subacute ischemia. Gray-white matter differentiation maintained. No foci susceptibility artifact to suggest acute or chronic intracranial hemorrhage. No evidence for chronic infarction. No mass lesion, midline  shift, or mass effect. No hydrocephalus. No extra-axial fluid collection. Major dural sinuses are grossly patent. No abnormal enhancement. No intrinsic temporal lobe abnormality. Pituitary gland and suprasellar region within normal limits. Midline structures intact and normal. Vascular: Major intracranial vascular flow voids are well preserved and are normal. Right vertebral artery is poorly visualized, likely diminutive/ hypoplastic. Skull and upper cervical spine: Craniocervical junction within normal limits. Visualized upper cervical spine unremarkable. Bone marrow signal intensity within normal limits. No scalp soft tissue abnormality. Sinuses/Orbits: Globes and orbital soft tissues within normal limits. Paranasal sinuses are clear. No mastoid effusion. Inner ear structures normal. Other: No other significant finding. IMPRESSION: Normal brain MRI.  No acute intracranial process identified. Electronically Signed   By: Rise Mu M.D.   On: 01/28/2017 01:32     LOS: 0 days   Jeoffrey Massed, MD  [facility] Pager:[phone]  If 7PM-7AM, please contact night-coverage [email or web address] Password [facility] 02/18/2017, 11:42 AM

## 2017-02-19 DIAGNOSIS — G40919 Epilepsy, unspecified, intractable, without status epilepticus: Secondary | ICD-10-CM | POA: Diagnosis not present

## 2017-02-19 DIAGNOSIS — G40209 Localization-related (focal) (partial) symptomatic epilepsy and epileptic syndromes with complex partial seizures, not intractable, without status epilepticus: Secondary | ICD-10-CM | POA: Diagnosis not present

## 2017-02-19 MED ORDER — DIPHENHYDRAMINE HCL 50 MG/ML IJ SOLN
25.0000 mg | Freq: Once | INTRAMUSCULAR | Status: AC
  Administered 2017-02-19: 25 mg via INTRAVENOUS
  Filled 2017-02-19: qty 1

## 2017-02-19 MED ORDER — LEVETIRACETAM 750 MG PO TABS: 1500.0000 mg | ORAL_TABLET | Freq: Two times a day (BID) | ORAL | 0 refills | Status: DC

## 2017-02-19 NOTE — Discharge Instructions (Signed)
Epilepsy Epilepsy is when a person keeps having seizures. A seizure is unusual activity in the brain. A seizure can change how you think or behave, and it can make it hard to be aware of what is happening. This condition can cause problems, such as:  Falls, accidents, and injury.  Depression.  Poor memory.  Sudden unexplained death in epilepsy (SUDEP). This is rare. Its cause is not known. Most people with epilepsy lead normal lives. Follow these instructions at home: Medicines    Take medicines only as told by your doctor.  Avoid anything that may keep your medicine from working, such as alcohol. Activity   Get enough rest. Lack of sleep can make seizures more likely to occur.  Follow your doctors advice about driving, swimming, and doing anything else that would be dangerous if you had a seizure. Teaching others  Teach friends and family what to Adrian Carter if you have a seizure. They should:  Lay you on the ground to prevent a fall.  Cushion your head and body.  Loosen any tight clothing around your neck.  Turn you on your side.  Stay with you until you are better.  Not hold you down.  Not put anything in your mouth.  Know whether or not you need emergency care. General instructions   Avoid anything that causes you to have seizures.  Keep a seizure diary. Write down what you remember about each seizure, and especially what might have caused it.  Keep all follow-up visits as told by your doctor. This is important. Contact a doctor if:  You have a change in your seizure pattern.  You get an infection or start to feel sick. You may have more seizures when you are sick. Get help right away if:  A seizure does not stop after 5 minutes.  You have more than one seizure in a row, and you Adrian Carter not have enough time between the seizures to feel better.  A seizure makes it harder to breathe.  A seizure is different from other seizures you have had.  A seizure makes you  unable to speak or use a part of your body.  You did not wake up right after a seizure. This information is not intended to replace advice given to you by your health care provider. Make sure you discuss any questions you have with your health care provider. Document Released: 09/18/2009 Document Revised: 06/27/2016 Document Reviewed: 05/31/2016 Elsevier Interactive Patient Education  2017 [employer]. Adrian Carter not drive, operate heavy machinery, perform activities at heights, swimming or participation in water activities or provide baby sitting services until you have seen by Primary MD or a Neurologist and advised to Adrian Carter so again.  Follow with Primary MD Adrian Carter, Adrian Carter, Adrian Carter and your Neurologist in 7 days   Get CBC, CMP, 2 view Chest X ray checked  by Primary MD or SNF MD in 5-7 days ( we routinely change or add medications that can affect your baseline labs and fluid status, therefore we recommend that you get the mentioned basic workup next visit with your PCP, your PCP may decide not to get them or add new tests based on their clinical decision)  Activity: As tolerated with Full fall precautions use walker/cane & assistance as needed  Disposition Home    Diet:  Heart Healthy   For Heart failure patients - Check your Weight same time everyday, if you gain over 2 pounds, or you develop in leg swelling, experience more shortness of breath  or chest pain, call your Primary MD immediately. Follow Cardiac Low Salt Diet and 1.5 lit/day fluid restriction.  On your next visit with your primary care physician please Get Medicines reviewed and adjusted.  Please request your Prim.MD to go over all Hospital Tests and Procedure/Radiological results at the follow up, please get all Hospital records sent to your Prim MD by signing hospital release before you go home.  If you experience worsening of your admission symptoms, develop shortness of breath, life threatening emergency, suicidal or homicidal thoughts  you must seek medical attention immediately by calling 911 or calling your MD immediately  if symptoms less severe.  You Must read complete instructions/literature along with all the possible adverse reactions/side effects for all the Medicines you take and that have been prescribed to you. Take any new Medicines after you have completely understood and accpet all the possible adverse reactions/side effects.   Adrian Carter not drive when taking Pain medications.    Adrian Carter not take more than prescribed Pain, Sleep and Anxiety Medications  Special Instructions: If you have smoked or chewed Tobacco  in the last 2 yrs please stop smoking, stop any regular Alcohol  and or any Recreational drug use.  Wear Seat belts while driving.   Please note  You were cared for by a hospitalist during your hospital stay. If you have any questions about your discharge medications or the care you received while you were in the hospital after you are discharged, you can call the unit and asked to speak with the hospitalist on call if the hospitalist that took care of you is not available. Once you are discharged, your primary care physician will handle any further medical issues. Please note that NO REFILLS for any discharge medications will be authorized once you are discharged, as it is imperative that you return to your primary care physician (or establish a relationship with a primary care physician if you Adrian Carter not have one) for your aftercare needs so that they can reassess your need for medications and monitor your lab values.

## 2017-02-19 NOTE — Discharge Summary (Signed)
Adrian Carter WUJ:[id number] DOB: September 28, 1976 DOA: 02/18/2017  PCP: Irena Reichmann, DO  Admit date: 02/18/2017  Discharge date: 02/19/2017  Admitted From: Home   Disposition:  Home   Recommendations for Outpatient Follow-up:   Follow up with PCP in 1-2 weeks  PCP Please obtain BMP/CBC, 2 view CXR in 1week,  (see Discharge instructions)   PCP Please follow up on the following pending results: None   Home Health: None   Equipment/Devices: None  Consultations: Neuro Discharge Condition: Stable   CODE STATUS: Full   Diet Recommendation:  Heart Healthy    No chief complaint on file.    Brief history of present illness from the day of admission and additional interim summary    Patient is a 41 y.o. male with history of seizures, autism, admitted to the hospital early this morning for evaluation of multiple breakthrough seizures. See below for further details                                                                 Hospital Course   Known history of seizures-with multiple breakthrough seizures over the past one week: Seen by neurology, case discussed with neurologist on 02/19/2017, cleared for discharge, patient now stable on increased dose of Keppra which is now 1500 mg twice a day along with his home dose of Topamax. Continue when necessary benzodiazepine at home, follow with PCP and primary neurologist within a week.  Paroxysmal atrial fibrillation: Continue flecainide and metoprolol- CHA2DSVASC of 1-continue ASA. Follows with Dr Tomie China in [place].  Hypertension: Controlled. Continue amlodipine and metoprolol.  Chronic pain syndrome: Continue with oxycodone as needed.  History of anxiety: Continue Valium as needed. Appears stable.   Discharge diagnosis     Principal Problem:   Breakthrough  seizure (HCC) Active Problems:   Seizure (HCC)   Essential hypertension   Paroxysmal a-fib (HCC)   Autism    Discharge instructions    Discharge Instructions    Diet - low sodium heart healthy    Complete by:  As directed    Discharge instructions    Complete by:  As directed    Do not drive, operate heavy machinery, perform activities at heights, swimming or participation in water activities or provide baby sitting services until you have seen by Primary MD or a Neurologist and advised to do so again.  Follow with Primary MD [name], [name], DO and your Neurologist in 7 days   Get CBC, CMP, 2 view Chest X ray checked  by Primary MD or SNF MD in 5-7 days ( we routinely change or add medications that can affect your baseline labs and fluid status, therefore we recommend that you get the mentioned basic workup next visit with your PCP, your PCP may decide not to get them or add new  tests based on their clinical decision)  Activity: As tolerated with Full fall precautions use walker/cane & assistance as needed  Disposition Home    Diet:  Heart Healthy   For Heart failure patients - Check your Weight same time everyday, if you gain over 2 pounds, or you develop in leg swelling, experience more shortness of breath or chest pain, call your Primary MD immediately. Follow Cardiac Low Salt Diet and 1.5 lit/day fluid restriction.  On your next visit with your primary care physician please Get Medicines reviewed and adjusted.  Please request your Prim.MD to go over all Hospital Tests and Procedure/Radiological results at the follow up, please get all Hospital records sent to your Prim MD by signing hospital release before you go home.  If you experience worsening of your admission symptoms, develop shortness of breath, life threatening emergency, suicidal or homicidal thoughts you must seek medical attention immediately by calling 911 or calling your MD immediately  if symptoms less  severe.  You Must read complete instructions/literature along with all the possible adverse reactions/side effects for all the Medicines you take and that have been prescribed to you. Take any new Medicines after you have completely understood and accpet all the possible adverse reactions/side effects.   Do not drive when taking Pain medications.    Do not take more than prescribed Pain, Sleep and Anxiety Medications  Special Instructions: If you have smoked or chewed Tobacco  in the last 2 yrs please stop smoking, stop any regular Alcohol  and or any Recreational drug use.  Wear Seat belts while driving.   Please note  You were cared for by a hospitalist during your hospital stay. If you have any questions about your discharge medications or the care you received while you were in the hospital after you are discharged, you can call the unit and asked to speak with the hospitalist on call if the hospitalist that took care of you is not available. Once you are discharged, your primary care physician will handle any further medical issues. Please note that NO REFILLS for any discharge medications will be authorized once you are discharged, as it is imperative that you return to your primary care physician (or establish a relationship with a primary care physician if you do not have one) for your aftercare needs so that they can reassess your need for medications and monitor your lab values.   Increase activity slowly    Complete by:  As directed       Discharge Medications   Allergies as of 02/19/2017      Reactions   Morphine And Related Anaphylaxis   Penicillins Anaphylaxis   Has patient had a PCN reaction causing immediate rash, facial/tongue/throat swelling, SOB or lightheadedness with hypotension: No Has patient had a PCN reaction causing severe rash involving mucus membranes or skin necrosis: No Has patient had a PCN reaction that required hospitalization No Has patient had a PCN  reaction occurring within the last 10 years: No If all of the above answers are "NO", then may proceed with Cephalosporin use.   Lisinopril Cough      Medication List    TAKE these medications   amLODipine 10 MG tablet Commonly known as:  NORVASC Take 1 tablet (10 mg total) by mouth daily. What changed:  how much to take   aspirin EC 81 MG tablet Take 81 mg by mouth daily.   diazepam 10 MG tablet Commonly known as:  VALIUM Take one tablet  twice per day as needed for anxiety   flecainide 50 MG tablet Commonly known as:  TAMBOCOR Take 1 tablet (50 mg total) by mouth 2 (two) times daily.   levETIRAcetam 750 MG tablet Commonly known as:  KEPPRA Take 2 tablets (1,500 mg total) by mouth 2 (two) times daily. What changed:  medication strength  how much to take  how to take this  when to take this  additional instructions   metoprolol 50 MG tablet Commonly known as:  LOPRESSOR Take 0.5 tablets (25 mg total) by mouth 2 (two) times daily.   montelukast 10 MG tablet Commonly known as:  SINGULAIR Take 10 mg by mouth at bedtime.   oxyCODONE 15 MG immediate release tablet Commonly known as:  ROXICODONE Take 15 mg by mouth every 8 (eight) hours as needed for pain.   pravastatin 20 MG tablet Commonly known as:  PRAVACHOL Take 20 mg by mouth at bedtime.   topiramate 25 MG tablet Commonly known as:  TOPAMAX Take 1 tablet (25 mg total) by mouth 2 (two) times daily.       Follow-up Information    [name], [name], DO. Schedule an appointment as soon as possible for a visit in 1 week(s).   Specialty:  Family Medicine Why:  and your Neurologist in 1 week Contact information: [address] [place] [address] [phone]           Major procedures and Radiology Reports - PLEASE review detailed and final reports thoroughly  -         Mr Laqueta Jean Wo Contrast  Result Date: 01/28/2017 CLINICAL DATA:  Initial evaluation for breakthrough seizure. EXAM:  MRI HEAD WITHOUT AND WITH CONTRAST TECHNIQUE: Multiplanar, multiecho pulse sequences of the brain and surrounding structures were obtained without and with intravenous contrast. CONTRAST:  66mL MULTIHANCE GADOBENATE DIMEGLUMINE 529 MG/ML IV SOLN COMPARISON:  Prior CT from 01/27/2017. FINDINGS: Brain: Study somewhat degraded by motion artifact. Cerebral volume within normal limits for age. No focal parenchymal signal abnormality identified. No abnormal foci of restricted diffusion to suggest acute or subacute ischemia. Gray-white matter differentiation maintained. No foci susceptibility artifact to suggest acute or chronic intracranial hemorrhage. No evidence for chronic infarction. No mass lesion, midline shift, or mass effect. No hydrocephalus. No extra-axial fluid collection. Major dural sinuses are grossly patent. No abnormal enhancement. No intrinsic temporal lobe abnormality. Pituitary gland and suprasellar region within normal limits. Midline structures intact and normal. Vascular: Major intracranial vascular flow voids are well preserved and are normal. Right vertebral artery is poorly visualized, likely diminutive/ hypoplastic. Skull and upper cervical spine: Craniocervical junction within normal limits. Visualized upper cervical spine unremarkable. Bone marrow signal intensity within normal limits. No scalp soft tissue abnormality. Sinuses/Orbits: Globes and orbital soft tissues within normal limits. Paranasal sinuses are clear. No mastoid effusion. Inner ear structures normal. Other: No other significant finding. IMPRESSION: Normal brain MRI.  No acute intracranial process identified. Electronically Signed   By: Rise Mu M.D.   On: 01/28/2017 01:32    Micro Results     No results found for this or any previous visit (from the past 240 hour(s)).  Today   Subjective    Adrian Carter today has no headache,no chest abdominal pain,no new weakness tingling or numbness, feels much better  wants to go home today.     Objective   Blood pressure 119/67, pulse (!) 58, temperature 98.4 F (36.9 C), temperature source Oral, resp. rate 18, height 6'  1" (1.854 m), weight 95.1 kg (209 lb 11.2 oz), SpO2 100 %.  No intake or output data in the 24 hours ending 02/19/17 1123  Exam Awake Alert,  No new F.N deficits, Normal affect [place].AT,PERRAL Supple Neck,No JVD, No cervical lymphadenopathy appriciated.  Symmetrical Chest wall movement, Good air movement bilaterally, CTAB RRR,No Gallops,Rubs or new Murmurs, No Parasternal Heave +ve B.Sounds, Abd Soft, Non tender, No organomegaly appriciated, No rebound -guarding or rigidity. No Cyanosis, Clubbing or edema, No new Rash or bruise   Data Review   CBC w Diff:  Lab Results  Component Value Date   WBC 7.7 02/18/2017   HGB 13.5 02/18/2017   HCT 42.1 02/18/2017   PLT 186 02/18/2017   LYMPHOPCT 26 01/27/2017   MONOPCT 9 01/27/2017   EOSPCT 1 01/27/2017   BASOPCT 0 01/27/2017    CMP:  Lab Results  Component Value Date   NA 138 02/18/2017   K 3.9 02/18/2017   CL 105 02/18/2017   CO2 25 02/18/2017   BUN 8 02/18/2017   CREATININE 0.96 02/18/2017   PROT 6.7 01/27/2017   ALBUMIN 3.6 01/27/2017   BILITOT 0.6 01/27/2017   ALKPHOS 103 01/27/2017   AST 17 01/27/2017   ALT 18 01/27/2017  .   Total Time in preparing paper work, data evaluation and todays exam - 35 minutes  Leroy Sea M.D on 02/19/2017 at 11:23 AM  [facility]   Office  [phone]

## 2017-02-19 NOTE — Progress Notes (Signed)
Patient left unit with all belongings by wheelchair accompanied by staff to private vehicle.

## 2017-02-19 NOTE — Progress Notes (Signed)
Patient given discharge instructions. All questions and concerns addressed.   

## 2017-02-19 NOTE — Evaluation (Signed)
Physical Therapy Evaluation Patient Details Name: Adrian Carter MRN: [id number] DOB: [birth year] Today's Date: 02/19/2017   History of Present Illness  Patient is a 41 yo male admitted 01/27/17 with breakthrough seizures.    PMH:  seizure disorder, autism, PAF  Clinical Impression  Patient evaluated by Physical Therapy with no further acute PT needs identified. All education has been completed and the patient has no further questions. At the time of PT eval pt was able to perform transfers and ambulation with supervision to modified independence. Supervision provided throughout session due to seizure precautions, however pt grossly functioning with mod I - states he is back to baseline. See below for any follow-up Physical Therapy or equipment needs. PT is signing off. Thank you for this referral.     Follow Up Recommendations No PT follow up    Equipment Recommendations  None recommended by PT    Recommendations for Other Services       Precautions / Restrictions Precautions Precautions: Fall Precaution Comments: Seizure disorder Restrictions Weight Bearing Restrictions: No      Mobility  Bed Mobility Overal bed mobility: Modified Independent             General bed mobility comments: Pt able to transition to/from EOB without difficulty. HOB flat and rails lowered to simulate home environment.   Transfers Overall transfer level: Modified independent               General transfer comment: No assist required. Pt denies dizziness.  Ambulation/Gait Ambulation/Gait assistance: Supervision;Modified independent (Device/Increase time) Ambulation Distance (Feet): 200 Feet Assistive device: None Gait Pattern/deviations: Step-through pattern;Decreased stride length Gait velocity: decreased Gait velocity interpretation: Below normal speed for age/gender General Gait Details: Slow but steady. Supervision initially for safety as pt on seizure precautions.  Stairs Stairs:  Yes Stairs assistance: Supervision Stair Management: Two rails;Alternating pattern;Forwards Number of Stairs: 11 General stair comments: Pt able to complete without difficulty. Supervision due to seizure precautions.   Wheelchair Mobility    Modified Rankin (Stroke Patients Only)       Balance Overall balance assessment: History of Falls                                           Pertinent Vitals/Pain Pain Assessment: No/denies pain    Home Living Family/patient expects to be discharged to:: Private residence Living Arrangements: Parent Available Help at Discharge: Family;Available 24 hours/day Type of Home: Mobile home Home Access: Stairs to enter Entrance Stairs-Rails: None Entrance Stairs-Number of Steps: 3 Home Layout: One level Home Equipment: Bedside commode;Walker - 2 wheels;Cane - single point      Prior Function Level of Independence: Needs assistance   Gait / Transfers Assistance Needed: Patient ambulates independently  ADL's / Homemaking Assistance Needed: Occasionally needs mom to assist with LB dressing on a "bad" day. Mom does all the cooking and cleaning        Hand Dominance   Dominant Hand: Left    Extremity/Trunk Assessment   Upper Extremity Assessment Upper Extremity Assessment: Overall WFL for tasks assessed    Lower Extremity Assessment Lower Extremity Assessment: Generalized weakness    Cervical / Trunk Assessment Cervical / Trunk Assessment: Other exceptions Cervical / Trunk Exceptions: Forward head/ rounded shoulder posture  Communication   Communication: No difficulties  Cognition Arousal/Alertness: Awake/alert Behavior During Therapy: WFL for tasks assessed/performed;Flat affect Overall Cognitive Status: No family/caregiver  present to determine baseline cognitive functioning (Likely baseline)                 General Comments: Oriented x4.  Able to answer questions appropriately.    General Comments       Exercises     Assessment/Plan    PT Assessment Patent does not need any further PT services  PT Problem List         PT Treatment Interventions      PT Goals (Current goals can be found in the Care Plan section)  Acute Rehab PT Goals Patient Stated Goal: To go home PT Goal Formulation: All assessment and education complete, DC therapy    Frequency     Barriers to discharge        Co-evaluation               End of Session Equipment Utilized During Treatment: Gait belt Activity Tolerance: Patient limited by fatigue Patient left: in bed;with call bell/phone within reach;with bed alarm set Nurse Communication: Mobility status PT Visit Diagnosis: History of falling (Z91.81);Other symptoms and signs involving the nervous system (R29.898)    Functional Assessment Tool Used: AM-PAC 6 Clicks Basic Mobility;Clinical judgement Functional Limitation: Mobility: Walking and moving around Mobility: Walking and Moving Around Current Status (O2423): At least 1 percent but less than 20 percent impaired, limited or restricted Mobility: Walking and Moving Around Goal Status [phone]): At least 1 percent but less than 20 percent impaired, limited or restricted Mobility: Walking and Moving Around Discharge Status [phone]): At least 1 percent but less than 20 percent impaired, limited or restricted    Time: 1044-1100 PT Time Calculation (min) (ACUTE ONLY): 16 min   Charges:   PT Evaluation $PT Eval Moderate Complexity: 1 Procedure     PT G Codes:   PT G-Codes **NOT FOR INPATIENT CLASS** Functional Assessment Tool Used: AM-PAC 6 Clicks Basic Mobility;Clinical judgement Functional Limitation: Mobility: Walking and moving around Mobility: Walking and Moving Around Current Status (M0867): At least 1 percent but less than 20 percent impaired, limited or restricted Mobility: Walking and Moving Around Goal Status [phone]): At least 1 percent but less than 20 percent impaired, limited  or restricted Mobility: Walking and Moving Around Discharge Status [phone]): At least 1 percent but less than 20 percent impaired, limited or restricted     Marylynn Pearson 02/19/2017, 12:32 PM   Conni Slipper, PT, DPT Acute Rehabilitation Services Pager: [phone]

## 2017-02-19 NOTE — Progress Notes (Signed)
Patient c/o severe generalized itching throughout day and night on 02/18/17. Patient now states itching "mainly on my neck". Patient has redness bilateral and posterior neck with small raised bumps. Area of rash cleanse and cool compress gave patient minor relief. Patient administered 25 mg IV benadryl per Dr. Amada Jupiter. RN will continue to monitor.

## 2017-02-19 NOTE — Progress Notes (Signed)
pateint not seen and this note will not be charged.  Spoke with the attending and pt has been doing well.  No new seizures. Back at baseline.      Past Medical History:  Diagnosis Date  . Autism   . PAF (paroxysmal atrial fibrillation) (HCC)   . Seizure [facility])     Family History  Problem Relation Age of Onset  . Hypertension Mother   . Heart failure Father      Social History:  reports that he has never smoked. He has never used smokeless tobacco. He reports that he does not drink alcohol or use drugs.   Exam: Current vital signs: BP 124/83 (BP Location: Right Arm)   Pulse 73   Temp 97.4 F (36.3 C) (Oral)   Resp 20   Ht 6\' 1"  (1.854 m)   Wt 95.1 kg (209 lb 11.2 oz)   SpO2 100%   BMI 27.67 kg/m  Vital signs in last 24 hours: Temp:  [97.4 F (36.3 C)-98.7 F (37.1 C)] 97.4 F (36.3 C) (03/18 0454) Pulse Rate:  [61-74] 73 (03/18 0454) Resp:  [19-20] 20 (03/18 0454) BP: (116-152)/(76-90) 124/83 (03/18 0454) SpO2:  [98 %-100 %] 100 % (03/18 0454)   A/p: hx of sz - p/w breakthrough sz; started on topamax by dr 09-07-1974.  May be discharged on present meds. Case d/w attending at length.

## 2017-03-09 ENCOUNTER — Telehealth: Payer: Self-pay | Admitting: Neurology

## 2017-03-09 NOTE — Telephone Encounter (Signed)
Patient mother called and states that patient is been in the hospital and DR collins patient PCP wants patient seen before 04-14-17. I looked and did not see anything and I put patient on the wait list but please advise if you will work patient in or what I should do

## 2017-03-09 NOTE — Telephone Encounter (Signed)
Will forward message to provider. 

## 2017-03-10 NOTE — Telephone Encounter (Signed)
Has he started the Topamax 25mg  BID? He should be taking Keppra 1500mg  BID and Topamax 25mg  BID. Can put him in 3pm slot on Wed 4/11. Pls ask mother to come with him on the next visit. Thanks

## 2017-03-13 NOTE — Telephone Encounter (Signed)
Clld pt's mom -   Mom verified/confirmed pt is taking Topamax 25 mg BID and Keppra 1500 mg BID.  Advsd mom of appt on Thursday 03/16/17 at 12 noon. Mom stated they will be here.

## 2017-03-16 ENCOUNTER — Ambulatory Visit: Payer: Medicare Other | Admitting: Neurology

## 2017-03-16 NOTE — Telephone Encounter (Signed)
Per provider request -   Clld [facility]/PCP - advsd Bridgette to inform Dr. Thomasena Edis pt clld/cncld f/u appt for today  - pt will have to keep May appt.   Bridgette stated she would let PCP know.

## 2017-03-17 ENCOUNTER — Encounter: Payer: Self-pay | Admitting: Neurology

## 2017-04-05 ENCOUNTER — Ambulatory Visit: Payer: Medicare Other | Admitting: Neurology

## 2017-04-19 ENCOUNTER — Ambulatory Visit (INDEPENDENT_AMBULATORY_CARE_PROVIDER_SITE_OTHER): Payer: Medicare Other | Admitting: Neurology

## 2017-04-19 ENCOUNTER — Encounter: Payer: Self-pay | Admitting: Neurology

## 2017-04-19 VITALS — BP 130/76 | HR 63 | Ht 69.0 in | Wt 208.0 lb

## 2017-04-19 DIAGNOSIS — G40009 Localization-related (focal) (partial) idiopathic epilepsy and epileptic syndromes with seizures of localized onset, not intractable, without status epilepticus: Secondary | ICD-10-CM

## 2017-04-19 MED ORDER — TOPIRAMATE 50 MG PO TABS: 50.0000 mg | ORAL_TABLET | Freq: Two times a day (BID) | ORAL | 11 refills | Status: DC

## 2017-04-19 MED ORDER — LEVETIRACETAM 750 MG PO TABS
1500.0000 mg | ORAL_TABLET | Freq: Two times a day (BID) | ORAL | 11 refills | Status: DC
Start: 2017-04-19 — End: 2017-08-03

## 2017-04-19 NOTE — Progress Notes (Signed)
NEUROLOGY FOLLOW UP OFFICE NOTE  Adrian Carter [id number]  HISTORY OF PRESENT ILLNESS: I had the pleasure of seeing Adrian Carter in follow-up in the neurology clinic on 04/19/2017.  The patient was last seen 3 months ago for recurrent seizures and is again accompanied by his caregiver who helps supplement the history today. Since his last visit, he had been admitted twice to [facility]. Last 01/27/17, he was brought to [facility] after a seizure at home, then had a witnessed generalized seizure at [place] and transferred to [facility]. He had an MRI brain with and without contrast which I personally reviewed which was normal. He was scheduled for an ambulatory EEG, which I personally reviewed. 24-hour EEG was normal, typical events were not captured. He was instructed to start the Topamax in addition to Keppra. He was back at [facility] on 02/18/17 again transferred from [place] after he continued to have seizures in the ER. He reported 8 seizures that day. There is note that he was able to grasp bed rails and grip ED staff hands during the seizure. He had not started the Topamax and was instructed to start medication. His CNA reports that since starting Topamax 25mg  BID, he has not had any further seizures. She herself has never witnessed any of the seizures. He reports he feels confused. His CNA feels some of them are from stress. He feels weak with the Topamax. He is also taking Keppra 1500mg  BID. He denies any headaches, dizziness, diplopia, focal numbness/tingling/weakness.  HPI 10/19/16: This is a 41 yo LH man with a history of hypertension, hyperlipidemia, atrial fibrillation, autism, presenting to establish care for his seizures. He is a poor historian and is unable to say when exactly seizures started. From records reviewed, when he first saw his PCP in March 2017, his mother reported 3 seizures in his lifetime. He was taking lorazepam 1mg  BID at that time. He was admitted in April 2017 to [facility] after he had  several seizures in one day. He is amnestic of events, but denied any fevers or head trauma at that time. He had been seeing neurologist Dr. Adella Hare until he closed his practice. On hospital discharge, he was given lorazepam 1mg  BID which he appears to have been taking regularly, and Keppra 500mg  BID. They report that the Ativan has been switched to Valium for agitation. He reports taking Keppra 1000mg  BID for the past 4-5 months (caregiver states she checked bottle before visit). They continue to report recurrent seizures, he had a cluster of 4 seizures last weekend. He reports vomiting after the seizures. He reports his left side feels weaker after, and would occasionally notices a burning smell. His caregiver has not witnessed any staring or unresponsive episodes. He denies any myoclonic jerks.   He has occasional throbbing headaches in the occipital region, at times with associated nausea. He reports dizziness with a spinning sensation occurring every other day when he gets out of bed, lasting about an hour. He reports horizontal diplopia. He has low back pain. No bowel/bladder dysfunction. He reports falling down steps on to his left arm, with continued throbbing pain in the left arm.   Epilepsy Risk Factors:  His paternal uncle had seizures. Patient has autism. As far as he knows, there is no history of febrile convulsions, CNS infections such as meningitis/encephalitis, significant traumatic brain injury, neurosurgical procedures.  Diagnostic Data: I personally reviewed MRI brain without contrast done 03/28/16 which was degraded by motion, no acute changes seen.  EEG done 03/28/16  was normal. There was excess diffuse beta activity seen  PAST MEDICAL HISTORY: Past Medical History:  Diagnosis Date  . Autism   . PAF (paroxysmal atrial fibrillation) (HCC)   . Seizure [facility])     MEDICATIONS: Current Outpatient Prescriptions on File Prior to Visit  Medication Sig Dispense Refill  . amLODipine  (NORVASC) 10 MG tablet Take 1 tablet (10 mg total) by mouth daily. (Patient taking differently: Take 5 mg by mouth daily. ) 30 tablet 0  . aspirin EC 81 MG tablet Take 81 mg by mouth daily.     . diazepam (VALIUM) 10 MG tablet Take one tablet twice per day as needed for anxiety    . flecainide (TAMBOCOR) 50 MG tablet Take 1 tablet (50 mg total) by mouth 2 (two) times daily.    Marland Kitchen levETIRAcetam (KEPPRA) 750 MG tablet Take 2 tablets (1,500 mg total) by mouth 2 (two) times daily. 60 tablet 0  . metoprolol (LOPRESSOR) 50 MG tablet Take 0.5 tablets (25 mg total) by mouth 2 (two) times daily.    . montelukast (SINGULAIR) 10 MG tablet Take 10 mg by mouth at bedtime.    Marland Kitchen oxyCODONE (ROXICODONE) 15 MG immediate release tablet Take 15 mg by mouth every 8 (eight) hours as needed for pain.    . pravastatin (PRAVACHOL) 20 MG tablet Take 20 mg by mouth at bedtime.    . topiramate (TOPAMAX) 25 MG tablet Take 1 tablet (25 mg total) by mouth 2 (two) times daily. 60 tablet 6   No current facility-administered medications on file prior to visit.     ALLERGIES: Allergies  Allergen Reactions  . Morphine And Related Anaphylaxis  . Penicillins Anaphylaxis    Has patient had a PCN reaction causing immediate rash, facial/tongue/throat swelling, SOB or lightheadedness with hypotension: No Has patient had a PCN reaction causing severe rash involving mucus membranes or skin necrosis: No Has patient had a PCN reaction that required hospitalization No Has patient had a PCN reaction occurring within the last 10 years: No If all of the above answers are "NO", then may proceed with Cephalosporin use.  Marland Kitchen Lisinopril Cough    FAMILY HISTORY: Family History  Problem Relation Age of Onset  . Hypertension Mother   . Heart failure Father     SOCIAL HISTORY: Social History   Social History  . Marital status: Single    Spouse name: N/A  . Number of children: N/A  . Years of education: N/A   Occupational History  .  unemployed    Social History Main Topics  . Smoking status: Never Smoker  . Smokeless tobacco: Never Used  . Alcohol use No  . Drug use: No  . Sexual activity: No   Other Topics Concern  . Not on file   Social History Narrative  . No narrative on file    REVIEW OF SYSTEMS: Constitutional: No fevers, chills, or sweats, no generalized fatigue, change in appetite Eyes: No visual changes, double vision, eye pain Ear, nose and throat: + hearing loss, no ear pain, nasal congestion, sore throat Cardiovascular: No chest pain, palpitations Respiratory:  No shortness of breath at rest or with exertion, wheezes GastrointestinaI: No nausea, vomiting, diarrhea, abdominal pain, fecal incontinence Genitourinary:  No dysuria, urinary retention or frequency Musculoskeletal:  No neck pain, back pain Integumentary: No rash, pruritus, skin lesions Neurological: as above Psychiatric: No depression, insomnia, anxiety Endocrine: No palpitations, fatigue, diaphoresis, mood swings, change in appetite, change in weight, increased thirst Hematologic/Lymphatic:  No anemia, purpura, petechiae. Allergic/Immunologic: no itchy/runny eyes, nasal congestion, recent allergic reactions, rashes  PHYSICAL EXAM: Vitals:   04/19/17 1008  BP: 130/76  Pulse: 63   General: No acute distress Head:  Normocephalic/atraumatic Neck: supple, no paraspinal tenderness, full range of motion Heart:  Regular rate and rhythm Lungs:  Clear to auscultation bilaterally Back: No paraspinal tenderness Skin/Extremities: No rash, no edema Neurological Exam: alert and oriented to person, place, and time. No aphasia or dysarthria. Fund of knowledge is appropriate.  Recent and remote memory are intact.  Attention and concentration are normal.    Able to name objects and repeat phrases. Cranial nerves: Pupils equal, round, reactive to light.  Extraocular movements intact with no nystagmus. Visual fields full. Facial sensation intact. No  facial asymmetry. Tongue, uvula, palate midline.  Motor: Bulk and tone normal, muscle strength 5/5 throughout with no pronator drift.  Sensation to light touch intact.  No extinction to double simultaneous stimulation.  Deep tendon reflexes 2+ throughout, toes downgoing.  Finger to nose testing intact.  Gait narrow-based and steady, able to tandem walk adequately.  Romberg negative.  IMPRESSION: This is a 41 yo LH man with a history of history of hypertension, hyperlipidemia, atrial fibrillation, autism, and seizures suggestive of focal to bilateral tonic-clonic epilepsy, he reports olfactory hallucinations and post-ictal left-sided weakness. However, there is concern raised about non-epileptic events, he would report up to 8 seizures in a day. His CNA has never seen any seizures. His 24-hour EEG was normal, however typical events were not captured. MRI brain normal. He is taking Keppra 750mg  2 tabs BID, and since Topamax was added in March, there have been no report of seizures. Increase dose to 50mg  BID. He reports feeling weak on the medication. He was encouraged to take it. Due to concern for the possibility of psychogenic non-epileptic events (?co-existing with epileptic seizures?), we discussed that if seizures continue on current medications, we will plan to do video EEG monitoring at [facility] to further classify seizures to help guide long-term management. He does not drive. He will follow-up in 3 months and knows to call for any changes.   Thank you for allowing me to participate in his care.  Please do not hesitate to call for any questions or concerns.  The duration of this appointment visit was 25 minutes of face-to-face time with the patient.  Greater than 50% of this time was spent in counseling, explanation of diagnosis, planning of further management, and coordination of care.   , M.D.   CC: Dr. [facility]

## 2017-04-19 NOTE — Patient Instructions (Signed)
1. Increase Topamax to 50mg  twice a day. With your current bottle of Topamax 25mg , take 2 tablets twice a day. After your bottle is done, your new prescription will be for Topamax 50mg , take 1 tablet twice a day 2. Continue Keppra 750mg , take 2 tablets twice a day 3. If seizures continue, call our office and we will send a referral for inpatient video EEG monitoring at [facility] 4. Follow-up in 3 months  Seizure Precautions: 1. If medication has been prescribed for you to prevent seizures, take it exactly as directed.  Do not stop taking the medicine without talking to your doctor first, even if you have not had a seizure in a long time.   2. Avoid activities in which a seizure would cause danger to yourself or to others.  Don't operate dangerous machinery, swim alone, or climb in high or dangerous places, such as on ladders, roofs, or girders.  Do not drive unless your doctor says you may.  3. If you have any warning that you may have a seizure, lay down in a safe place where you can't hurt yourself.    4.  No driving for 6 months from last seizure, as per [facility].   Please refer to the following link on the Epile[employer] of [employer]'s website for more information: http://www.epilepsyfoundation.org/answerplace/Social/driving/drivingu.cfm   5.  Maintain good sleep hygiene. Avoid alcohol.  6.  Contact your doctor if you have any problems that may be related to the medicine you are taking.  7.  Call 911 and bring the patient back to the ED if:        A.  The seizure lasts longer than 5 minutes.       B.  The patient doesn't awaken shortly after the seizure  C.  The patient has new problems such as difficulty seeing, speaking or moving  D.  The patient was injured during the seizure  E.  The patient has a temperature over 102 F (39C)  F.  The patient vomited and now is having trouble breathing

## 2017-04-21 ENCOUNTER — Ambulatory Visit: Payer: Medicare Other | Admitting: Neurology

## 2017-05-18 ENCOUNTER — Ambulatory Visit: Payer: Medicare Other | Admitting: Neurology

## 2017-05-30 ENCOUNTER — Ambulatory Visit: Payer: Medicare Other | Admitting: Neurology

## 2017-07-03 ENCOUNTER — Telehealth: Payer: Self-pay | Admitting: Neurology

## 2017-07-03 DIAGNOSIS — R569 Unspecified convulsions: Secondary | ICD-10-CM

## 2017-07-03 DIAGNOSIS — G40009 Localization-related (focal) (partial) idiopathic epilepsy and epileptic syndromes with seizures of localized onset, not intractable, without status epilepticus: Secondary | ICD-10-CM

## 2017-07-03 NOTE — Telephone Encounter (Signed)
Pls have caregiver increase Topamax 50mg : Take 1 tab in AM, 2 tabs in PM. Continue Keppra. Yes, I think he should have the inpatient video EEG monitoring at [facility], pls send referral to [place], thanks

## 2017-07-03 NOTE — Telephone Encounter (Signed)
Care giver called and stated the patient has been at the hospital this weekend.  Has had several seizures.  Asked if it was time to do the 3 day study to determine why the patient is having these. Jola Babinski - [phone]

## 2017-07-04 NOTE — Telephone Encounter (Signed)
Called pt.  No answer.  Phone just kept ringing.  Orders placed and faxed to [facility]

## 2017-07-06 ENCOUNTER — Other Ambulatory Visit: Payer: Self-pay

## 2017-07-06 ENCOUNTER — Telehealth: Payer: Self-pay

## 2017-07-06 ENCOUNTER — Other Ambulatory Visit: Payer: Self-pay | Admitting: Neurology

## 2017-07-06 DIAGNOSIS — G40009 Localization-related (focal) (partial) idiopathic epilepsy and epileptic syndromes with seizures of localized onset, not intractable, without status epilepticus: Secondary | ICD-10-CM

## 2017-07-06 MED ORDER — TOPIRAMATE 50 MG PO TABS: ORAL_TABLET | ORAL | 11 refills | Status: DC

## 2017-07-06 NOTE — Telephone Encounter (Signed)
ERROR

## 2017-07-06 NOTE — Telephone Encounter (Signed)
Spoke with Mardella Layman, relayed message below.  She asked about sending a new Rx for the increased dosage for Topamax - will send to pt's listed preferred pharmacy.

## 2017-07-06 NOTE — Telephone Encounter (Signed)
Rx sent to [facility] in [place], [place]

## 2017-07-06 NOTE — Addendum Note (Signed)
Addended by: Horatio Pel on: 07/06/2017 03:06 PM   Modules accepted: Orders

## 2017-07-10 DIAGNOSIS — R251 Tremor, unspecified: Secondary | ICD-10-CM

## 2017-07-10 HISTORY — DX: Tremor, unspecified: R25.1

## 2017-07-18 DIAGNOSIS — M069 Rheumatoid arthritis, unspecified: Secondary | ICD-10-CM

## 2017-07-18 DIAGNOSIS — F1320 Sedative, hypnotic or anxiolytic dependence, uncomplicated: Secondary | ICD-10-CM

## 2017-07-18 DIAGNOSIS — F1120 Opioid dependence, uncomplicated: Secondary | ICD-10-CM

## 2017-07-18 HISTORY — DX: Rheumatoid arthritis, unspecified: M06.9

## 2017-07-18 HISTORY — DX: Opioid dependence, uncomplicated: F11.20

## 2017-07-18 HISTORY — DX: Sedative, hypnotic or anxiolytic dependence, uncomplicated: F13.20

## 2017-07-28 DIAGNOSIS — E785 Hyperlipidemia, unspecified: Secondary | ICD-10-CM

## 2017-07-28 HISTORY — DX: Hyperlipidemia, unspecified: E78.5

## 2017-08-03 ENCOUNTER — Encounter: Payer: Self-pay | Admitting: Neurology

## 2017-08-03 ENCOUNTER — Ambulatory Visit (INDEPENDENT_AMBULATORY_CARE_PROVIDER_SITE_OTHER): Payer: Medicare Other | Admitting: Neurology

## 2017-08-03 VITALS — BP 152/84 | HR 59 | Ht 69.0 in | Wt 203.0 lb

## 2017-08-03 DIAGNOSIS — F445 Conversion disorder with seizures or convulsions: Secondary | ICD-10-CM | POA: Insufficient documentation

## 2017-08-03 DIAGNOSIS — F419 Anxiety disorder, unspecified: Secondary | ICD-10-CM | POA: Insufficient documentation

## 2017-08-03 DIAGNOSIS — G43009 Migraine without aura, not intractable, without status migrainosus: Secondary | ICD-10-CM

## 2017-08-03 HISTORY — DX: Anxiety disorder, unspecified: F41.9

## 2017-08-03 HISTORY — DX: Migraine without aura, not intractable, without status migrainosus: G43.009

## 2017-08-03 HISTORY — DX: Conversion disorder with seizures or convulsions: F44.5

## 2017-08-03 MED ORDER — TOPIRAMATE 50 MG PO TABS: ORAL_TABLET | ORAL | 11 refills | Status: DC

## 2017-08-03 NOTE — Patient Instructions (Addendum)
1. Continue Topamax 50mg  1 tab in AM, 2 tabs in PM for the migraines 2. Discuss sleep problems with Dr. 3. If you start feeling a stress seizure coming on, take deep breaths, get yourself to a quiet place 4. Follow-up in 6 months, call for any changes

## 2017-08-03 NOTE — Progress Notes (Signed)
NEUROLOGY FOLLOW UP OFFICE NOTE  Adrian Carter [id number]  HISTORY OF PRESENT ILLNESS: I had the pleasure of seeing Adrian Carter in follow-up in the neurology clinic on 08/03/2017.  The patient was last seen 3 months ago for recurrent seizures and is again accompanied by his caregiver who helps supplement the history today. He continued to report seizures on Keppra and Topamax, and had been to the hospital several times for these. He had inpatient video EEG monitoring at [facility] for 2 days. He had an episode of blank stare and unresponsiveness/not following commands, with no EEG changes seen. He had another episode of head and bilateral leg shaking again with no EEG changes seen. His baseline EEG was normal. 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 Topamax was continued for headaches, and gabapentin continued for pain. He denies any seizures since hospital admission at the beginning of the month. His caregiver states that she and the patient's mother have agreed that the stress from family gatherings may be contributing because "they are loud." He states migraines are controlled on current dose of Topamax 50mg  in AM, 100mg  in PM. His caregiver is concerned about his sleep, his days and nights are reversed.  HPI 10/19/16: This is a 41 yo LH man with a history of hypertension, hyperlipidemia, atrial fibrillation, autism, presenting to establish care for his seizures. He is a poor historian and is unable to say when exactly seizures started. From records reviewed, when he first saw his PCP in March 2017, his mother reported 3 seizures in his lifetime. He was taking lorazepam 1mg  BID at that time. He was admitted in April 2017 to [facility] after he had several seizures in one day. He is amnestic of events, but denied any fevers or head trauma at that time. He had been seeing neurologist Dr. Adella Hare until he closed his practice. On hospital discharge, he was given  lorazepam 1mg  BID which he appears to have been taking regularly, and Keppra 500mg  BID. They report that the Ativan has been switched to Valium for agitation. He reports taking Keppra 1000mg  BID for the past 4-5 months (caregiver states she checked bottle before visit). They continue to report recurrent seizures, he had a cluster of 4 seizures last weekend. He reports vomiting after the seizures. He reports his left side feels weaker after, and would occasionally notices a burning smell. His caregiver has not witnessed any staring or unresponsive episodes. He denies any myoclonic jerks.   He has occasional throbbing headaches in the occipital region, at times with associated nausea. He reports dizziness with a spinning sensation occurring every other day when he gets out of bed, lasting about an hour. He reports horizontal diplopia. He has low back pain. No bowel/bladder dysfunction. He reports falling down steps on to his left arm, with continued throbbing pain in the left arm.   Epilepsy Risk Factors:  His paternal uncle had seizures. Patient has autism. As far as he knows, there is no history of febrile convulsions, CNS infections such as meningitis/encephalitis, significant traumatic brain injury, neurosurgical procedures.  Diagnostic Data: I personally reviewed MRI brain without contrast done 03/28/16 which was degraded by motion, no acute changes seen.  EEG done 03/28/16 was normal. There was excess diffuse beta activity seen  PAST MEDICAL HISTORY: Past Medical History:  Diagnosis Date  . Autism   . PAF (paroxysmal atrial fibrillation) (HCC)   . Seizure [facility])     MEDICATIONS:  Outpatient Encounter Prescriptions as of  08/03/2017  Medication Sig Note  . amLODipine (NORVASC) 10 MG tablet Take 1 tablet (10 mg total) by mouth daily. (Patient taking differently: Take 5 mg by mouth daily. )   . aspirin EC 81 MG tablet Take 81 mg by mouth daily.    . diazepam (VALIUM) 10 MG tablet Take one tablet  twice per day as needed for anxiety   . flecainide (TAMBOCOR) 50 MG tablet Take 1 tablet (50 mg total) by mouth 2 (two) times daily.   . metoprolol (LOPRESSOR) 50 MG tablet Take 0.5 tablets (25 mg total) by mouth 2 (two) times daily.   . montelukast (SINGULAIR) 10 MG tablet Take 10 mg by mouth at bedtime.   Marland Kitchen oxyCODONE (ROXICODONE) 15 MG immediate release tablet Take 15 mg by mouth every 8 (eight) hours as needed for pain. 01/28/2017: Verified through outside record last filled on 01/20/17  . pravastatin (PRAVACHOL) 20 MG tablet Take 20 mg by mouth at bedtime.   . topiramate (TOPAMAX) 50 MG tablet TAKE 1 TABLET BY MOUTH EVERY MORNING AND 2 TABLETS EVERY EVENING   . [DISCONTINUED] levETIRAcetam (KEPPRA) 750 MG tablet Take 2 tablets (1,500 mg total) by mouth 2 (two) times daily.   . [DISCONTINUED] topiramate (TOPAMAX) 50 MG tablet TAKE 1 TABLET BY MOUTH EVERY MORNING AND 2 TABLETS EVERY EVENING    No facility-administered encounter medications on file as of 08/03/2017.     ALLERGIES: Allergies  Allergen Reactions  . Morphine And Related Anaphylaxis  . Penicillins Anaphylaxis    Has patient had a PCN reaction causing immediate rash, facial/tongue/throat swelling, SOB or lightheadedness with hypotension: No Has patient had a PCN reaction causing severe rash involving mucus membranes or skin necrosis: No Has patient had a PCN reaction that required hospitalization No Has patient had a PCN reaction occurring within the last 10 years: No If all of the above answers are "NO", then may proceed with Cephalosporin use.  Marland Kitchen Lisinopril Cough    FAMILY HISTORY: Family History  Problem Relation Age of Onset  . Hypertension Mother   . Heart failure Father     SOCIAL HISTORY: Social History   Social History  . Marital status: Single    Spouse name: N/A  . Number of children: N/A  . Years of education: N/A   Occupational History  . unemployed    Social History Main Topics  . Smoking status:  Never Smoker  . Smokeless tobacco: Never Used  . Alcohol use No  . Drug use: No  . Sexual activity: No   Other Topics Concern  . Not on file   Social History Narrative  . No narrative on file    REVIEW OF SYSTEMS: Constitutional: No fevers, chills, or sweats, no generalized fatigue, change in appetite Eyes: No visual changes, double vision, eye pain Ear, nose and throat: + hearing loss, no ear pain, nasal congestion, sore throat Cardiovascular: No chest pain, palpitations Respiratory:  No shortness of breath at rest or with exertion, wheezes GastrointestinaI: No nausea, vomiting, diarrhea, abdominal pain, fecal incontinence Genitourinary:  No dysuria, urinary retention or frequency Musculoskeletal:  No neck pain, back pain Integumentary: No rash, pruritus, skin lesions Neurological: as above Psychiatric: No depression, insomnia, anxiety Endocrine: No palpitations, fatigue, diaphoresis, mood swings, change in appetite, change in weight, increased thirst Hematologic/Lymphatic:  No anemia, purpura, petechiae. Allergic/Immunologic: no itchy/runny eyes, nasal congestion, recent allergic reactions, rashes  PHYSICAL EXAM: Vitals:   08/03/17 1118  BP: (!) 152/84  Pulse: (!) 59  SpO2: 98%   General: No acute distress Head:  Normocephalic/atraumatic Neck: supple, no paraspinal tenderness, full range of motion Heart:  Regular rate and rhythm Lungs:  Clear to auscultation bilaterally Back: No paraspinal tenderness Skin/Extremities: No rash, no edema Neurological Exam: alert and oriented to person, place, and time. No aphasia or dysarthria. Fund of knowledge is appropriate.  Recent and remote memory are intact.  Attention and concentration are normal.    Able to name objects and repeat phrases. Cranial nerves: Pupils equal, round, reactive to light.  Extraocular movements intact with no nystagmus. Visual fields full. Facial sensation intact. No facial asymmetry. Tongue, uvula, palate  midline.  Motor: Bulk and tone normal, muscle strength 5/5 throughout with no pronator drift.  Sensation to light touch intact.  No extinction to double simultaneous stimulation.  Deep tendon reflexes 2+ throughout, toes downgoing.  Finger to nose testing intact.  Gait narrow-based and steady, able to tandem walk adequately.  Romberg negative.  IMPRESSION: This is a 41 yo LH man with a history of history of hypertension, hyperlipidemia, atrial fibrillation, autism, and seizures. 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 He underwent video EEG monitoring at [facility], staring spell and shaking spell captured were non-epileptic. Keppra was discontinued, he continues to take Topamax 50mg  in AM, 100mg  in PM for migraines. We discussed the diagnosis, he does have some cognitive delay which makes understanding the diagnosis more complicated, this was discussed with his caregiver to relay to his mother. He denies any seizures since hospital stay at the beginning of the month. We discussed avoidance of stressors. They will discuss sleep concerns with Dr. . He does not drive. He will follow-up in 6 months and knows to call for any changes.   Thank you for allowing me to participate in his care.  Please do not hesitate to call for any questions or concerns.  The duration of this appointment visit was 25 minutes of face-to-face time with the patient.  Greater than 50% of this time was spent in counseling, explanation of diagnosis, planning of further management, and coordination of care.   , M.D.   CC: Dr. Sherral Hammers

## 2017-08-04 IMAGING — MR MR HEAD WO/W CM
11 of 14 series · 32 of 48 positions shown · IV contrast (multihance)
Comparison: Prior CT from 01/27/2017.

CLINICAL DATA: Initial evaluation for breakthrough seizure.

EXAM:
MRI HEAD WITHOUT AND WITH CONTRAST
TECHNIQUE: Multiplanar, multiecho pulse sequences of the brain and surrounding
structures were obtained without and with intravenous contrast.
CONTRAST:  19mL MULTIHANCE GADOBENATE DIMEGLUMINE 529 MG/ML IV SOLN

[Series 2: FLAIR · sagittal · 5.0mm · 0.47mm/px · 1 of 23 slices shown (1 of 2)]
[im 1/23]
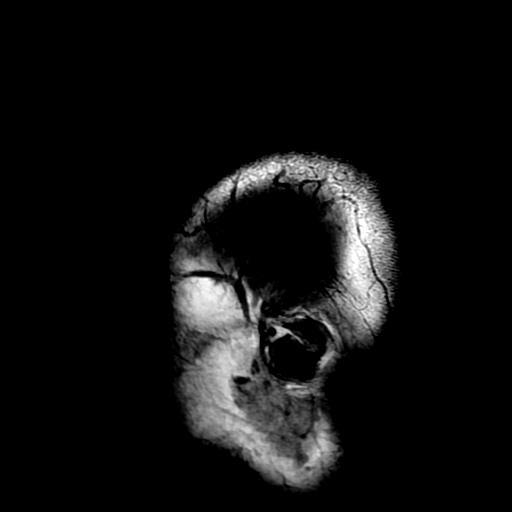

[Series 4: DWI · axial · 3.0mm · 0.94mm/px · z∈[-45,+95]mm · 7 of 96 slices shown (1 of 2)]
[im 1/96]
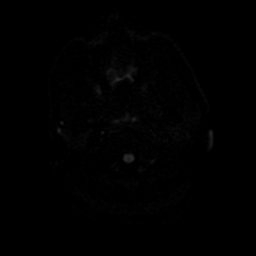
[im 16/96]
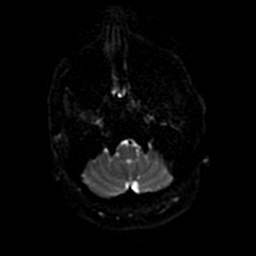
[im 32/96]
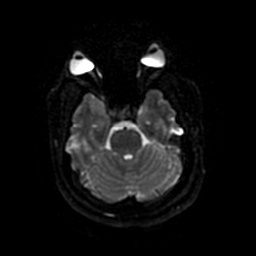
[im 48/96]
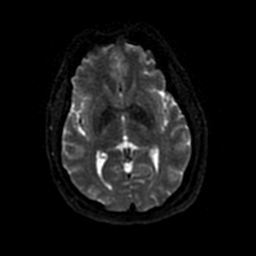
[im 64/96]
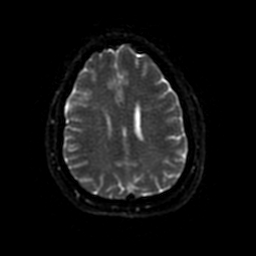
[im 80/96]
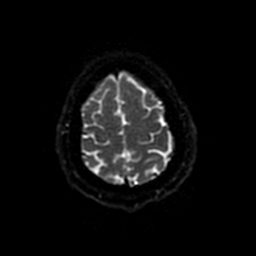
[im 96/96]
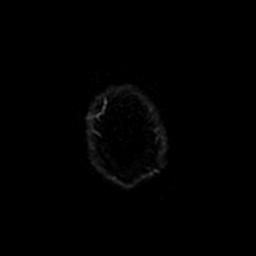

[Series 5: T2 · axial · 5.0mm · 0.47mm/px · z∈[-46,+97]mm · 2 of 25 slices shown]
[im 1/25]
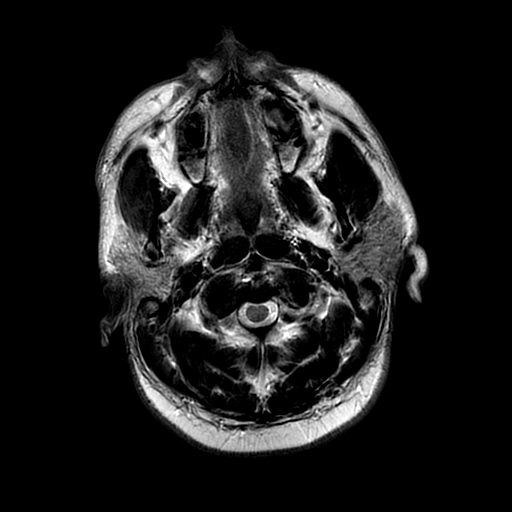
[im 25/25]
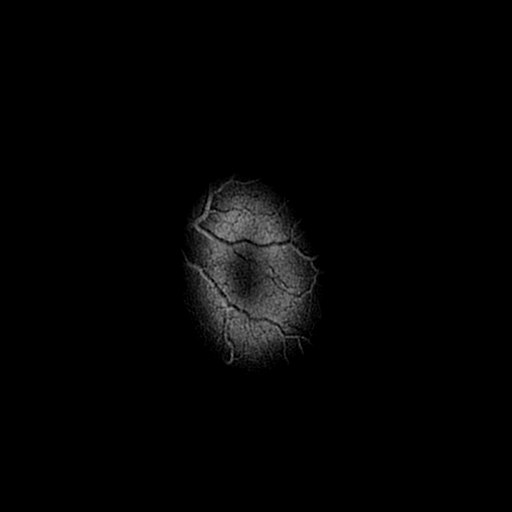

[Series 6: FLAIR · axial · 5.0mm · 0.47mm/px · z∈[-46,+97]mm · 2 of 25 slices shown (2 of 2)]
[im 1/25]
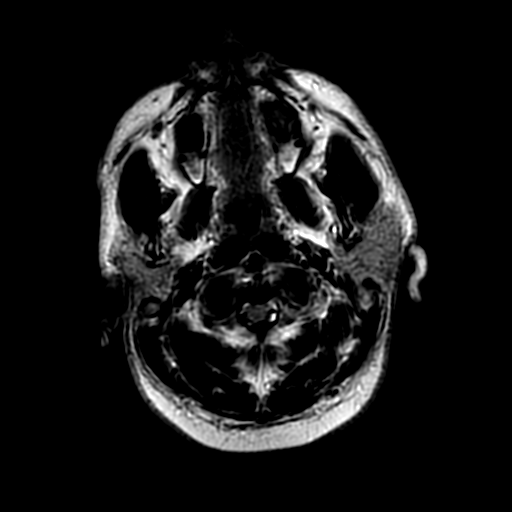
[im 25/25]
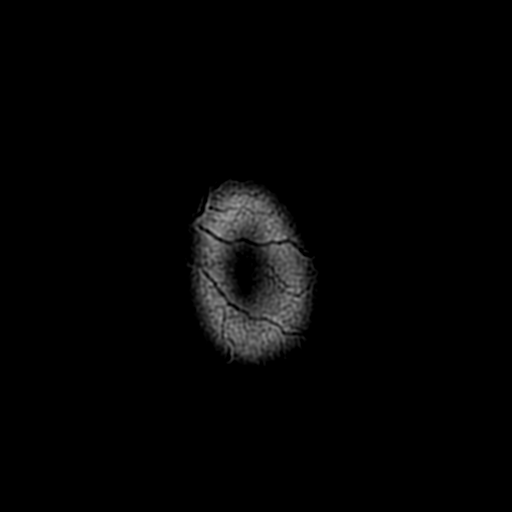

[Series 7: DWI · coronal · 4.0mm · 0.94mm/px · 5 of 68 slices shown (2 of 2)]
[im 1/68]
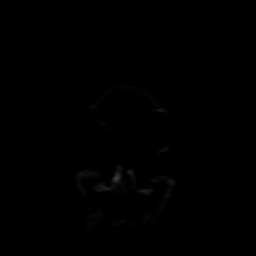
[im 17/68]
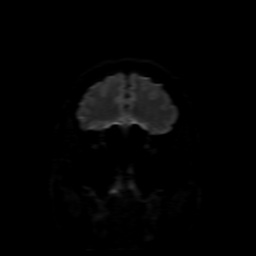
[im 34/68]
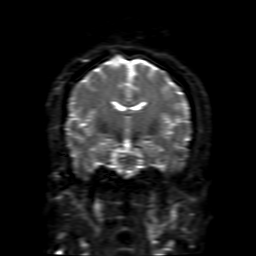
[im 51/68]
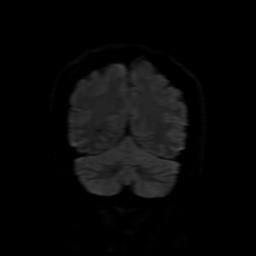
[im 68/68]
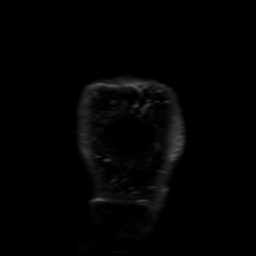

[Series 10: T2 fat-sat · coronal · 3.0mm · 0.43mm/px · 2 of 26 slices shown]
[im 1/26]
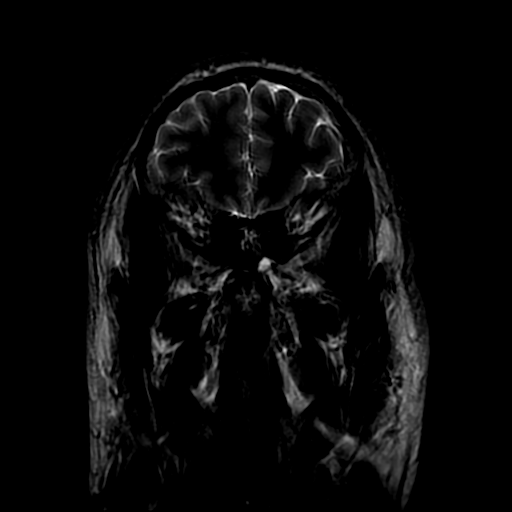
[im 26/26]
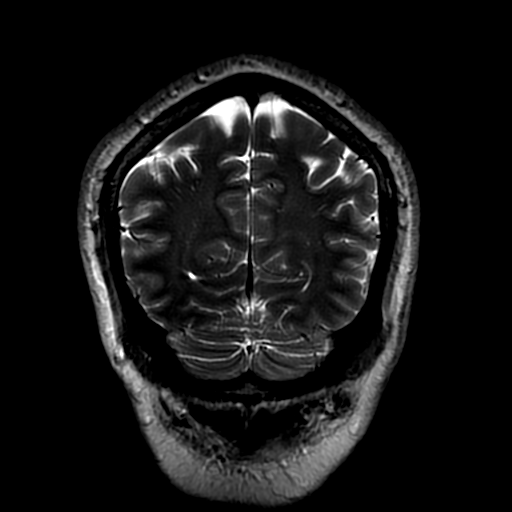

[Series 11: T2 post-contrast · coronal · 5.0mm · 0.39mm/px · 2 of 28 slices shown]
[im 1/28]
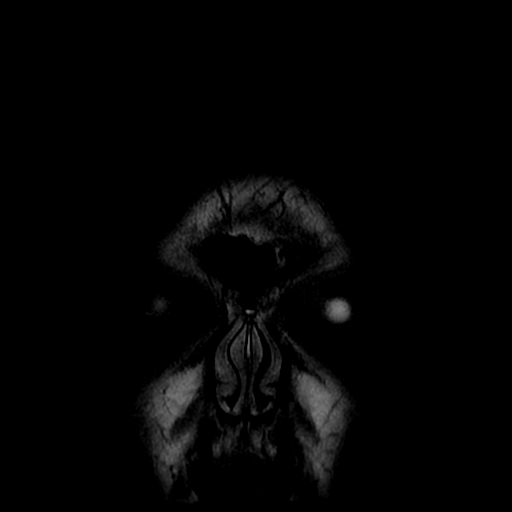
[im 28/28]
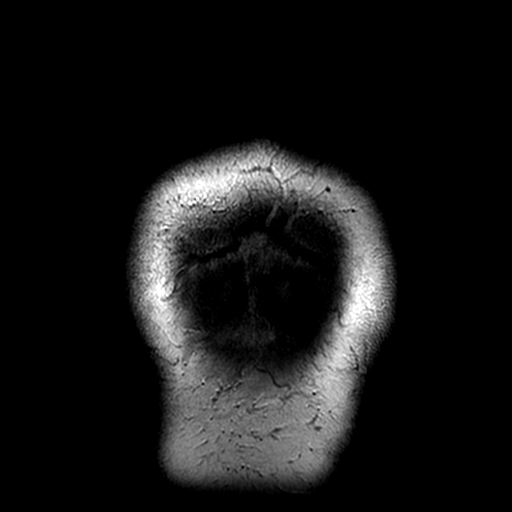

[Series 13: T1 · coronal · 5.0mm · 0.43mm/px · 2 of 28 slices shown (1 of 2)]
[im 1/28]
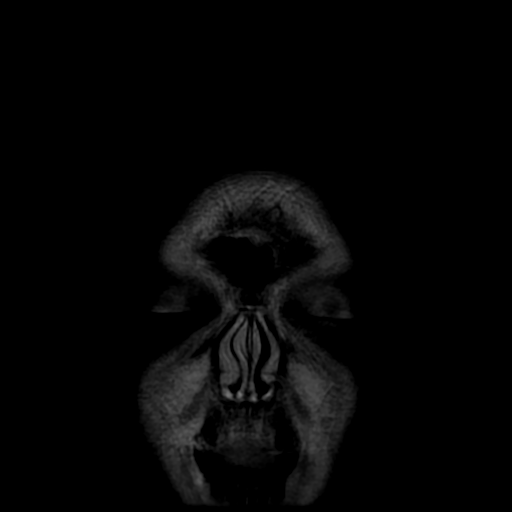
[im 28/28]
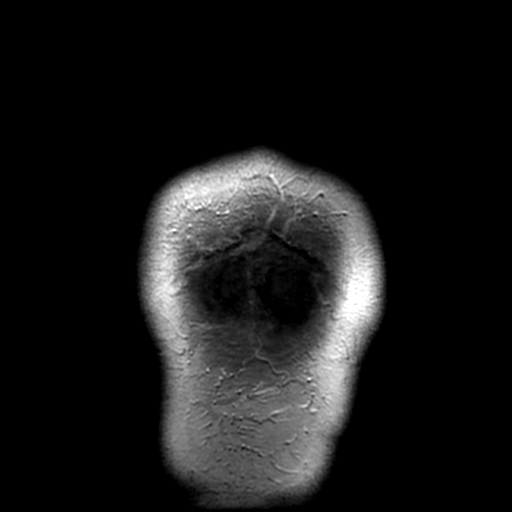

[Series 14: T1 · axial · 5.0mm · 0.47mm/px · z∈[-46,+97]mm · 2 of 25 slices shown (2 of 2)]
[im 1/25]
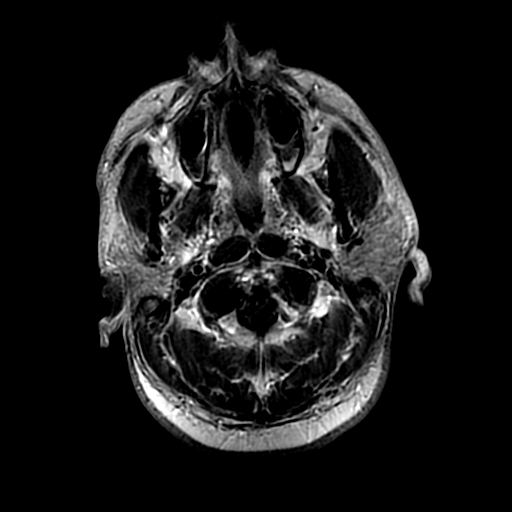
[im 25/25]
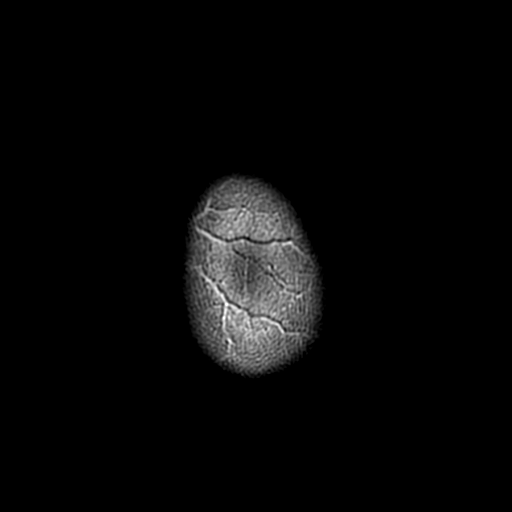

[Series 450: ADC · axial · 3.0mm · 0.94mm/px · z∈[-45,+95]mm · 4 of 47 slices shown (1 of 2)]
[im 1/47]
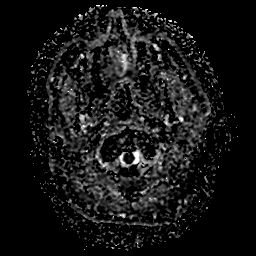
[im 16/47]
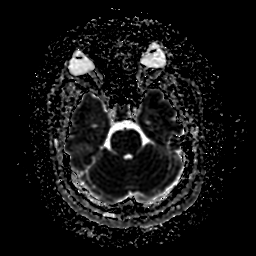
[im 31/47]
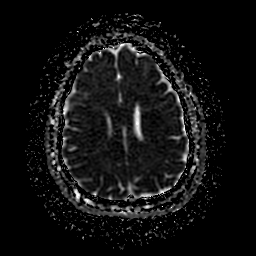
[im 47/47]
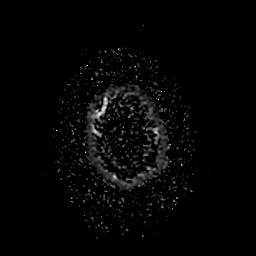

[Series 750: ADC · coronal · 4.0mm · 0.94mm/px · 3 of 34 slices shown (2 of 2)]
[im 1/34]
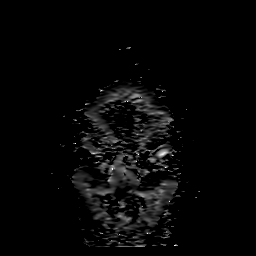
[im 17/34]
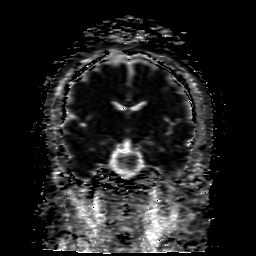
[im 34/34]
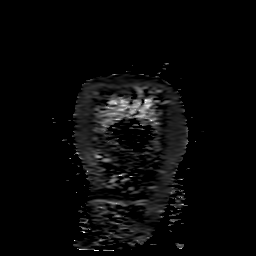

[32 of 48 positions shown; findings below may reference images not displayed]

FINDINGS: Brain: Study somewhat degraded by motion artifact.

Cerebral volume within normal limits for age. No focal parenchymal
signal abnormality identified. No abnormal foci of restricted
diffusion to suggest acute or subacute ischemia. Gray-white matter
differentiation maintained. No foci susceptibility artifact to
suggest acute or chronic intracranial hemorrhage. No evidence for
chronic infarction.

No mass lesion, midline shift, or mass effect. No hydrocephalus. No
extra-axial fluid collection. Major dural sinuses are grossly
patent. No abnormal enhancement. No intrinsic temporal lobe
abnormality.

Pituitary gland and suprasellar region within normal limits. Midline
structures intact and normal.

Vascular: Major intracranial vascular flow voids are well preserved
and are normal. Right vertebral artery is poorly visualized, likely
diminutive/ hypoplastic.

Skull and upper cervical spine: Craniocervical junction within
normal limits. Visualized upper cervical spine unremarkable. Bone
marrow signal intensity within normal limits. No scalp soft tissue
abnormality.

Sinuses/Orbits: Globes and orbital soft tissues within normal
limits. Paranasal sinuses are clear. No mastoid effusion. Inner ear
structures normal.

Other: No other significant finding.
IMPRESSION: Normal brain MRI.  No acute intracranial process identified.

## 2018-02-09 ENCOUNTER — Ambulatory Visit: Payer: Medicare Other | Admitting: Neurology

## 2018-04-19 ENCOUNTER — Ambulatory Visit: Payer: Medicare Other | Admitting: Sports Medicine

## 2018-04-26 ENCOUNTER — Ambulatory Visit: Payer: Medicare Other | Admitting: Sports Medicine

## 2018-08-10 ENCOUNTER — Ambulatory Visit: Payer: Medicare Other | Admitting: Neurology

## 2018-12-31 ENCOUNTER — Ambulatory Visit: Payer: Medicare Other | Admitting: Cardiology

## 2020-09-14 ENCOUNTER — Other Ambulatory Visit: Payer: Self-pay

## 2020-09-14 ENCOUNTER — Ambulatory Visit (INDEPENDENT_AMBULATORY_CARE_PROVIDER_SITE_OTHER): Payer: Medicare Other | Admitting: Podiatry

## 2020-09-14 DIAGNOSIS — Z5329 Procedure and treatment not carried out because of patient's decision for other reasons: Secondary | ICD-10-CM

## 2020-09-14 NOTE — Progress Notes (Signed)
No show for appt. 

## 2020-10-01 ENCOUNTER — Ambulatory Visit (INDEPENDENT_AMBULATORY_CARE_PROVIDER_SITE_OTHER): Payer: Medicare Other | Admitting: Podiatry

## 2020-10-01 DIAGNOSIS — Z5329 Procedure and treatment not carried out because of patient's decision for other reasons: Secondary | ICD-10-CM

## 2020-10-01 NOTE — Progress Notes (Signed)
No show for appt. 

## 2023-05-02 ENCOUNTER — Other Ambulatory Visit: Payer: Self-pay

## 2023-05-02 DIAGNOSIS — I10 Essential (primary) hypertension: Secondary | ICD-10-CM | POA: Insufficient documentation

## 2023-05-02 DIAGNOSIS — M1990 Unspecified osteoarthritis, unspecified site: Secondary | ICD-10-CM | POA: Insufficient documentation

## 2023-05-02 DIAGNOSIS — E669 Obesity, unspecified: Secondary | ICD-10-CM | POA: Insufficient documentation

## 2023-05-02 DIAGNOSIS — I499 Cardiac arrhythmia, unspecified: Secondary | ICD-10-CM | POA: Insufficient documentation

## 2023-05-03 NOTE — Progress Notes (Unsigned)
Cardiology Office Note:    Date:  05/04/2023   ID:  Adrian Carter, DOB 1976-08-19, MRN [id number]  PCP:  Jearld Lesch, MD  Cardiologist:  Norman Herrlich, MD   Referring MD: Irena Reichmann, DO  ASSESSMENT:    1. Palpitations   2. Paroxysmal A-fib (HCC)   3. Essential hypertension   4. Hyperlipidemia, unspecified hyperlipidemia type    PLAN:    In order of problems listed above:  He has recurrent atrial fibrillation undetermined duration relatively asymptomatic rate controlled and continues on flecainide His CHA2DS2-VASc score is 1 at this time unguinal hold on anticoagulation Apply a ZIO monitor to see if atrial fibrillation is persistent and his heart rates with his complaints of dizziness Echocardiogram to assess structural heart disease Home sleep study Reassess in the office in 4 weeks if he has persistent atrial fibrillation I think he would benefit from EP catheter ablation  Next appointment 4 weeks   Medication Adjustments/Labs and Tests Ordered: Current medicines are reviewed at length with the patient today.  Concerns regarding medicines are outlined above.  No orders of the defined types were placed in this encounter.  No orders of the defined types were placed in this encounter.      History of Present Illness:    Adrian Carter is a 47 y.o. male with a history of hypertension hyperlipidemia and atrial fibrillation being seen [facility] regional cardiology August 2018 and was maintaining sinus rhythm at that time with flecainide who is being seen today for the evaluation of palpitation at the request of Irena Reichmann, DO.  She was seen April 03, 2023 with complaints of irregular heartbeat and syncope.  And referred for cardiology evaluation  He recently was found to be in atrial fibrillation he tells me by his PCP and he said that there had not been an examination for quite a while before that He is not really very aware of this except at times he gets  lightheaded He walks every day and is having no exercise intolerance palpitation edema shortness of breath or chest pain He does not use alcohol He arouses frequently during the night fatigue during the day and snores severely he said he was to have a sleep apnea study years ago but was never done Past Medical History:  Diagnosis Date   Anxiety 08/03/2017   Autism    Benzodiazepine dependence (HCC) 07/18/2017   Breakthrough seizure (HCC) 01/27/2017   Chronic pain syndrome 02/10/2016   Degenerative joint disease    Dyslipidemia 07/28/2017   Essential hypertension 03/27/2016   Hyperlipidemia    Hypertension    Irregular heart rhythm    Migraine without aura and without status migrainosus, not intractable 08/03/2017   Obesity    Paroxysmal A-fib (HCC)    Primary insomnia 02/10/2016   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   diazepam (VALIUM) 10 MG tablet Take one tablet twice per day as needed for anxiety   flecainide (TAMBOCOR) 100 MG tablet Take 100 mg by mouth 2 (two) times daily.   furosemide (  LASIX) 20 MG tablet Take 20 mg by mouth daily.   gemfibrozil (LOPID) 600 MG tablet Take 600 mg by mouth 2 (two) times daily.   ibuprofen (ADVIL) 800 MG tablet Take 800 mg by mouth 3 (three) times daily as needed for fever, headache or mild pain.   levETIRAcetam (KEPPRA) 500 MG tablet Take 500 mg by mouth 2 (two) times daily.   lubiprostone (AMITIZA) 8 MCG capsule Take 8 mcg by mouth 2 (two) times daily with a meal.   mirtazapine (REMERON) 30 MG  tablet Take 30 mg by mouth at bedtime.   naloxone (NARCAN) nasal spray 4 mg/0.1 mL Place 0.4 mg into the nose once.   Oxycodone HCl 10 MG TABS Take 10 mg by mouth 4 (four) times daily as needed (pain).     Allergies:   Morphine and codeine, Penicillins, and Lisinopril   Social History   Socioeconomic History   Marital status: Single    Spouse name: Not on file   Number of children: Not on file   Years of education: Not on file   Highest education level: Not on file  Occupational History   Occupation: unemployed  Tobacco Use   Smoking status: Never   Smokeless tobacco: Never  Substance and Sexual Activity   Alcohol use: No    Alcohol/week: 0.0 standard drinks of alcohol   Drug use: No   Sexual activity: Never  Other Topics Concern   Not on file  Social History Narrative   Not on file   Social Determinants of Health   Financial Resource Strain: Not on file  Food Insecurity: Not on file  Transportation Needs: Not on file  Physical Activity: Not on file  Stress: Not on file  Social Connections: Not on file     Family History: The patient's family history includes Heart failure in his father; Hypertension in his mother.  ROS:   ROS Please see the history of present illness.     All other systems reviewed and are negative.  EKGs/Labs/Other Studies Reviewed:    The following studies were reviewed today:       EKG:  EKG is  ordered today.  The ekg ordered today is personally reviewed and demonstrates rate controlled atrial fibrillation    Physical Exam:    VS:  BP 130/82   Pulse 94   Ht 6\' 1"  (1.854 m)   Wt 222 lb 6.4 oz (100.9 kg)   SpO2 97%   BMI 29.34 kg/m     Wt Readings from Last 3 Encounters:  05/04/23 222 lb 6.4 oz (100.9 kg)  08/03/17 203 lb (92.1 kg)  04/19/17 208 lb (94.3 kg)     GEN: Irregular rate and rhythm well nourished, well developed in no acute distress HEENT: Normal NECK: No JVD; No carotid bruits LYMPHATICS: No  lymphadenopathy CARDIAC: Variable first heart sound atrial fibrillation no murmur RESPIRATORY:  Clear to auscultation without rales, wheezing or rhonchi  ABDOMEN: Soft, non-tender, non-distended MUSCULOSKELETAL:  No edema; No deformity  SKIN: Warm and dry NEUROLOGIC:  Alert and oriented x 3 PSYCHIATRIC:  Normal affect     Signed, Norman Herrlich, MD  05/04/2023 1:38 PM    [facility]

## 2023-05-04 ENCOUNTER — Ambulatory Visit: Payer: 59 | Attending: Cardiology

## 2023-05-04 ENCOUNTER — Encounter: Payer: Self-pay | Admitting: Cardiology

## 2023-05-04 ENCOUNTER — Ambulatory Visit: Payer: 59 | Attending: Cardiology | Admitting: Cardiology

## 2023-05-04 VITALS — BP 130/82 | HR 94 | Ht 73.0 in | Wt 222.4 lb

## 2023-05-04 DIAGNOSIS — I10 Essential (primary) hypertension: Secondary | ICD-10-CM

## 2023-05-04 DIAGNOSIS — E785 Hyperlipidemia, unspecified: Secondary | ICD-10-CM

## 2023-05-04 DIAGNOSIS — I480 Paroxysmal atrial fibrillation: Secondary | ICD-10-CM

## 2023-05-04 DIAGNOSIS — R002 Palpitations: Secondary | ICD-10-CM

## 2023-05-04 NOTE — Patient Instructions (Addendum)
Medication Instructions:  Your physician recommends that you continue on your current medications as directed. Please refer to the Current Medication list given to you today.  *If you need a refill on your cardiac medications before your next appointment, please call your pharmacy*   Lab Work: Your physician recommends that you return for lab work in:   Labs today: CBC, CMP, TSH  If you have labs (blood work) drawn today and your tests are completely normal, you will receive your results only by: MyChart Message (if you have MyChart) OR A paper copy in the mail If you have any lab test that is abnormal or we need to change your treatment, we will call you to review the results.   Testing/Procedures: Your physician has requested that you have an echocardiogram. Echocardiography is a painless test that uses sound waves to create images of your heart. It provides your doctor with information about the size and shape of your heart and how well your heart's chambers and valves are working. This procedure takes approximately one hour. There are no restrictions for this procedure. Please do NOT wear cologne, perfume, aftershave, or lotions (deodorant is allowed). Please arrive 15 minutes prior to your appointment time.  A zio monitor was ordered today. It will remain on for 7 days. You will then return monitor and event diary in provided box. It takes 1-2 weeks for report to be downloaded and returned to [place]. We will call you with the results. If monitor falls off or has orange flashing light, please call Zio for further instructions.   Sleep Apnea Test   Follow-Up: At [facility], you and your health needs are our priority.  As part of our continuing mission to provide you with exceptional heart care, we have created designated Provider Care Teams.  These Care Teams include your primary Cardiologist (physician) and Advanced Practice Providers (APPs -  Physician Assistants and Nurse  Practitioners) who all work together to provide you with the care you need, when you need it.  We recommend signing up for the patient portal called "MyChart".  Sign up information is provided on this After Visit Summary.  MyChart is used to connect with patients for Virtual Visits (Telemedicine).  Patients are able to view lab/test results, encounter notes, upcoming appointments, etc.  Non-urgent messages can be sent to your provider as well.   To learn more about what you can do with MyChart, go to [email or web address].    Your next appointment:   4 week(s)  Provider:   Norman Herrlich, MD    Other Instructions None

## 2023-05-05 LAB — COMPREHENSIVE METABOLIC PANEL
ALT: 15 IU/L (ref 0–44)
AST: 14 IU/L (ref 0–40)
Albumin/Globulin Ratio: 1.3 (ref 1.2–2.2)
Albumin: 4.3 g/dL (ref 4.1–5.1)
Alkaline Phosphatase: 126 IU/L — ABNORMAL HIGH (ref 44–121)
BUN/Creatinine Ratio: 13 (ref 9–20)
BUN: 15 mg/dL (ref 6–24)
Bilirubin Total: 0.4 mg/dL (ref 0.0–1.2)
CO2: 23 mmol/L (ref 20–29)
Calcium: 9.8 mg/dL (ref 8.7–10.2)
Chloride: 103 mmol/L (ref 96–106)
Creatinine, Ser: 1.14 mg/dL (ref 0.76–1.27)
Globulin, Total: 3.2 g/dL (ref 1.5–4.5)
Glucose: 99 mg/dL (ref 70–99)
Potassium: 4.5 mmol/L (ref 3.5–5.2)
Sodium: 141 mmol/L (ref 134–144)
Total Protein: 7.5 g/dL (ref 6.0–8.5)
eGFR: 81 mL/min/{1.73_m2} (ref >59)

## 2023-05-05 LAB — CBC
Hematocrit: 48.5 % (ref 37.5–51.0)
Hemoglobin: 14.4 g/dL (ref 13.0–17.7)
MCH: 21.1 pg — ABNORMAL LOW (ref 26.6–33.0)
MCHC: 29.7 g/dL — ABNORMAL LOW (ref 31.5–35.7)
MCV: 71 fL — ABNORMAL LOW (ref 79–97)
Platelets: 260 10*3/uL (ref 150–450)
RBC: 6.81 x10E6/uL — ABNORMAL HIGH (ref 4.14–5.80)
RDW: 20 % — ABNORMAL HIGH (ref 11.6–15.4)
WBC: 8.5 10*3/uL (ref 3.4–10.8)

## 2023-05-05 LAB — TSH: TSH: 2.15 u[IU]/mL (ref 0.450–4.500)

## 2023-05-15 ENCOUNTER — Telehealth: Payer: Self-pay | Admitting: Cardiology

## 2023-05-15 NOTE — Telephone Encounter (Signed)
Kim from Dr. Mayford Knife office would like patient's last OV notes faxed to them at [phone]

## 2023-05-15 NOTE — Telephone Encounter (Signed)
I called the patient, no answer and VM is full.

## 2023-05-17 NOTE — Telephone Encounter (Signed)
Returned patient call again, and no answer or VM

## 2023-05-30 ENCOUNTER — Ambulatory Visit: Payer: 59 | Attending: Cardiology

## 2023-05-31 NOTE — Progress Notes (Signed)
 No show. Closing erroneous encounter. No charge.   Caitlin S Walker, NP

## 2023-06-01 ENCOUNTER — Ambulatory Visit: Payer: 59 | Attending: Cardiology | Admitting: Cardiology

## 2023-06-01 DIAGNOSIS — I10 Essential (primary) hypertension: Secondary | ICD-10-CM

## 2023-06-01 DIAGNOSIS — I480 Paroxysmal atrial fibrillation: Secondary | ICD-10-CM

## 2023-06-01 DIAGNOSIS — R002 Palpitations: Secondary | ICD-10-CM

## 2023-06-13 ENCOUNTER — Encounter: Payer: Self-pay | Admitting: Cardiology

## 2024-05-03 ENCOUNTER — Encounter: Payer: Self-pay | Admitting: Physician Assistant

## 2024-11-21 ENCOUNTER — Ambulatory Visit

## 2024-11-26 ENCOUNTER — Other Ambulatory Visit: Payer: Self-pay

## 2024-11-26 NOTE — Progress Notes (Deleted)
 " Cardiology Office Note:    Date:  11/26/2024   ID:  Adrian Carter, DOB [birth year], MRN [id number]  PCP:  Adrian Vaughan HERO, MD  Cardiologist:  Adrian Leiter, MD    Referring MD: Adrian Mickey Browner, PA-C    ASSESSMENT:    1. Paroxysmal A-fib (HCC)   2. Essential hypertension   3. Chronic anticoagulation    PLAN:    In order of problems listed above:  ***   Next appointment: ***   Medication Adjustments/Labs and Tests Ordered: Current medicines are reviewed at length with the patient today.  Concerns regarding medicines are outlined above.  No orders of the defined types were placed in this encounter.  No orders of the defined types were placed in this encounter.    History of Present Illness:    Adrian Carter is a 48 y.o. male with a hx of hypertension hyperlipidemia and paroxysmal atrial fibrillation last seen in 05/04/2023 . After that visit she utilized an event Carter showing atrial fibrillation with 12% burden symptomatic.  She was seen with her PCP 11/13/2024 at that time was taken flecainide  100 mg twice daily and Eliquis.  At the time of the visit and says that she was out of her anticonvulsant and flecainide .  She was restarted on flecainide  is found to be in atrial fibrillation that day is also's brief started on her anticoagulant and referred back to cardiology. 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    Rheumatoid arthritis (HCC) 07/18/2017   Spells of trembling 07/10/2017   Uncomplicated opioid dependence (HCC) 07/18/2017    Current Medications: Active Medications[1]    EKGs/Labs/Other Studies Reviewed:    The following studies were reviewed today:  Cardiac Studies & Procedures   ______________________________________________________________________________________________        Adrian  LONG TERM Carter (3-14 DAYS) 05/22/2023  Narrative Patch Wear Time:  5 days and 17 hours (2024-05-30T14:01:43-0400 to 2024-06-05T07:16:11-399)  Patient had a min HR of 51 bpm, max HR of 245 bpm, and avg HR of 80 bpm. Predominant underlying rhythm was Sinus Rhythm.  There were no triggered or diary events  Atrial Fibrillation occurred (12% burden), ranging from 66-245 bpm (avg of 125 bpm), the longest lasting 8 hours 38 mins with an avg rate of 123 bpm. Some episodes of Atrial Fibrillation conducted with possible aberrancy.  Atrial Fibrillation was present at activation of device.  1 Pause occurred lasting 3.2 secs (19 bpm).  Isolated SVEs were frequent (6.6%, [id number]), SVE Couplets were rare (<1.0%, 559), and SVE Triplets were rare (<1.0%, 83).  Isolated VEs were rare (<1.0%), and no VE Couplets or VE Triplets were present.       ______________________________________________________________________________________________          Recent Labs: No results found for requested labs within last 365 days.  Recent Lipid Panel No results found for: CHOL, TRIG, HDL, CHOLHDL, VLDL, LDLCALC, LDLDIRECT  Physical Exam:    VS:  There were  no vitals taken for this visit.    Wt Readings from Last 3 Encounters:  05/04/23 222 lb 6.4 oz (100.9 kg)  08/03/17 203 lb (92.1 kg)  04/19/17 208 lb (94.3 kg)     GEN: *** Well nourished, well developed in no acute distress HEENT: Normal NECK: No JVD; No carotid bruits LYMPHATICS: No lymphadenopathy CARDIAC: ***RRR, no  murmurs, rubs, gallops RESPIRATORY:  Clear to auscultation without rales, wheezing or rhonchi  ABDOMEN: Soft, non-tender, non-distended MUSCULOSKELETAL:  No edema; No deformity  SKIN: Warm and dry NEUROLOGIC:  Alert and oriented x 3 PSYCHIATRIC:  Normal affect    Signed, Adrian Leiter, MD  11/26/2024 12:34 PM    Adrian Carter     [1]  No outpatient medications have been marked as taking for the 11/27/24 encounter (Appointment) with Carter Adrian PARAS, MD.   "

## 2024-11-27 ENCOUNTER — Ambulatory Visit: Attending: Cardiology | Admitting: Cardiology

## 2024-11-27 DIAGNOSIS — Z7901 Long term (current) use of anticoagulants: Secondary | ICD-10-CM

## 2024-11-27 DIAGNOSIS — I480 Paroxysmal atrial fibrillation: Secondary | ICD-10-CM

## 2024-11-27 DIAGNOSIS — I10 Essential (primary) hypertension: Secondary | ICD-10-CM
